# Patient Record
Sex: Female | Born: 1995 | Race: Black or African American | Hispanic: No | Marital: Single | Smoking: Never smoker
Health system: Southern US, Community
[De-identification: ages and names within clinical notes are randomized; demographics above are authoritative.]

## PROBLEM LIST (undated history)

## (undated) DIAGNOSIS — J302 Other seasonal allergic rhinitis: Secondary | ICD-10-CM

## (undated) DIAGNOSIS — J45909 Unspecified asthma, uncomplicated: Secondary | ICD-10-CM

## (undated) DIAGNOSIS — G43909 Migraine, unspecified, not intractable, without status migrainosus: Secondary | ICD-10-CM

## (undated) HISTORY — PX: NO PAST SURGERIES: SHX2092

## (undated) HISTORY — DX: Migraine, unspecified, not intractable, without status migrainosus: G43.909

## (undated) HISTORY — DX: Other seasonal allergic rhinitis: J30.2

---

## 2017-04-16 ENCOUNTER — Ambulatory Visit (HOSPITAL_COMMUNITY)
Admission: EM | Admit: 2017-04-16 | Discharge: 2017-04-16 | Disposition: A | Discharge status: Home / Self Care | Payer: Medicaid Other | Attending: Internal Medicine | Admitting: Internal Medicine

## 2017-04-16 ENCOUNTER — Encounter (HOSPITAL_COMMUNITY): Payer: Self-pay | Admitting: Nurse Practitioner

## 2017-04-16 DIAGNOSIS — N926 Irregular menstruation, unspecified: Secondary | ICD-10-CM | POA: Diagnosis not present

## 2017-04-16 DIAGNOSIS — N898 Other specified noninflammatory disorders of vagina: Secondary | ICD-10-CM | POA: Diagnosis not present

## 2017-04-16 LAB — POCT PREGNANCY, URINE: Preg Test, Ur: NEGATIVE

## 2017-04-16 NOTE — ED Provider Notes (Signed)
CSN: [id number]     Arrival date & time 04/16/17  1639 History   None    Chief Complaint  Patient presents with  . Menstrual Problem   (Consider location/radiation/quality/duration/timing/severity/associated sxs/prior Treatment) 21 year old female comes in for 3 month history of irregular periods. Patient states she has had her period since 21 years old and he usually comes at the same time every month. However, these past 3 months, she has had intermittent spotting, but no full cycle to speak of. She had unprotected sex once back 3 months ago, states partner pulled out, no other birth control. She has also had some vaginal discharge for the past 3 months, white in color, watery, no odor. Admits to gaining weight easily, cold intolerance, changes in skin/hair texture. Denies fever, chills, night sweats. Denies urinary frequency, dysuria, hematuria. Denies abdominal pain, nausea, vomiting, diarrhea.      History reviewed. No pertinent past medical history. History reviewed. No pertinent surgical history. No family history on file. Social History  Substance Use Topics  . Smoking status: Never Smoker  . Smokeless tobacco: Never Used  . Alcohol use No   OB History    No data available     Review of Systems  Constitutional: Negative for chills, diaphoresis and fever.  Gastrointestinal: Negative for abdominal pain, diarrhea, nausea and vomiting.  Endocrine: Negative for cold intolerance.  Genitourinary: Positive for menstrual problem, vaginal bleeding and vaginal discharge. Negative for decreased urine volume, difficulty urinating, dysuria, flank pain, hematuria, pelvic pain, urgency and vaginal pain.    Allergies  Patient has no known allergies.  Home Medications   Prior to Admission medications   Not on File   Meds Ordered and Administered this Visit  Medications - No data to display  BP 122/74   Pulse 80   Temp 98.7 F (37.1 C) (Oral)   Resp 16   LMP 02/07/2017   SpO2  100%  No data found.   Physical Exam  Constitutional: She is oriented to person, place, and time. She appears well-developed and well-nourished. No distress.  HENT:  Head: Normocephalic and atraumatic.  Eyes: Pupils are equal, round, and reactive to light. Conjunctivae are normal.  Neck: Normal range of motion. Neck supple. No thyromegaly present.  Cardiovascular: Normal rate, regular rhythm and normal heart sounds.  Exam reveals no gallop and no friction rub.   No murmur heard. Pulmonary/Chest: Effort normal and breath sounds normal. She has no wheezes. She has no rales.  Abdominal: Soft. Bowel sounds are normal. She exhibits no mass. There is no tenderness. There is no rebound and no guarding.  Neurological: She is alert and oriented to person, place, and time.  Skin: Skin is warm and dry.  Psychiatric: She has a normal mood and affect. Her behavior is normal. Judgment normal.    Urgent Care Course     Procedures (including critical care time)  Labs Review Labs Reviewed  POCT PREGNANCY, URINE  URINE CYTOLOGY ANCILLARY ONLY    Imaging Review No results found.      MDM   1. Irregular menstrual cycle    Discussed lab results with patient, urine pregnancy negative today. Will send urine for cytology, patient will be contacted with any positive results, additional treatment needed will be sent in then. Discussed with patient possible causes of irregular cycle, including thyroid disease, stress, changes in hormone. Patient to follow up with GYN for further workup. Resources given to patient.    Belinda FisherYu, Amy V, PA-C 04/16/17 1806

## 2017-04-16 NOTE — Discharge Instructions (Signed)
Your urine was negative for pregnancy. It will be sent off to cytology, any positive results that need additional treatment will be called in then. Follow up with GYN for further evaluation of your symptoms.

## 2017-04-16 NOTE — ED Triage Notes (Addendum)
Pt presents with c/o irregular menstrual cycle. She reports she's had irregular, lighter periods since April. She has taken four pregnancy tests at home that were negative. Shes also had an abnormal light white vaginal discharge for the past two months.

## 2017-04-17 LAB — URINE CYTOLOGY ANCILLARY ONLY
Chlamydia: NEGATIVE
Neisseria Gonorrhea: NEGATIVE
Trichomonas: NEGATIVE

## 2017-04-19 LAB — URINE CYTOLOGY ANCILLARY ONLY: Candida vaginitis: NEGATIVE

## 2017-04-20 ENCOUNTER — Telehealth (HOSPITAL_COMMUNITY): Payer: Self-pay | Admitting: Internal Medicine

## 2017-04-20 MED ORDER — METRONIDAZOLE 500 MG PO TABS: 500.0000 mg | ORAL_TABLET | Freq: Two times a day (BID) | ORAL | 0 refills | Status: DC

## 2017-04-20 NOTE — Telephone Encounter (Signed)
Clinical staff, please let patient know that test for gardnerella (bacterial vaginosis) was positive.  Rx metronidazole was sent to [facility] pharmacy [facility].  Recheck for further evaluation if symptoms are not improving.  LM

## 2017-04-24 MED FILL — metroNIDAZOLE 500 MG TABS: 500 | 7 days supply | Qty: 14 | Fill #0

## 2017-08-17 ENCOUNTER — Emergency Department (HOSPITAL_COMMUNITY): Payer: Medicaid Other

## 2017-08-17 ENCOUNTER — Other Ambulatory Visit: Payer: Self-pay

## 2017-08-17 ENCOUNTER — Encounter (HOSPITAL_COMMUNITY): Payer: Self-pay | Admitting: *Deleted

## 2017-08-17 ENCOUNTER — Emergency Department (HOSPITAL_COMMUNITY)
Admission: EM | Admit: 2017-08-17 | Discharge: 2017-08-17 | Disposition: A | Discharge status: Home / Self Care | Payer: Medicaid Other | Attending: Emergency Medicine | Admitting: Emergency Medicine

## 2017-08-17 DIAGNOSIS — R000 Tachycardia, unspecified: Secondary | ICD-10-CM | POA: Insufficient documentation

## 2017-08-17 DIAGNOSIS — Z79899 Other long term (current) drug therapy: Secondary | ICD-10-CM | POA: Insufficient documentation

## 2017-08-17 DIAGNOSIS — J40 Bronchitis, not specified as acute or chronic: Secondary | ICD-10-CM | POA: Diagnosis not present

## 2017-08-17 DIAGNOSIS — R112 Nausea with vomiting, unspecified: Secondary | ICD-10-CM | POA: Diagnosis not present

## 2017-08-17 DIAGNOSIS — R0789 Other chest pain: Secondary | ICD-10-CM | POA: Diagnosis present

## 2017-08-17 DIAGNOSIS — R0602 Shortness of breath: Secondary | ICD-10-CM | POA: Insufficient documentation

## 2017-08-17 DIAGNOSIS — J45909 Unspecified asthma, uncomplicated: Secondary | ICD-10-CM | POA: Insufficient documentation

## 2017-08-17 DIAGNOSIS — R05 Cough: Secondary | ICD-10-CM | POA: Insufficient documentation

## 2017-08-17 DIAGNOSIS — R0981 Nasal congestion: Secondary | ICD-10-CM | POA: Diagnosis not present

## 2017-08-17 LAB — BASIC METABOLIC PANEL
Anion gap: 7 (ref 5–15)
BUN: 12 mg/dL (ref 6–20)
CO2: 23 mmol/L (ref 22–32)
Calcium: 9.1 mg/dL (ref 8.9–10.3)
Chloride: 107 mmol/L (ref 101–111)
Creatinine, Ser: 0.56 mg/dL (ref 0.44–1.00)
GFR calc Af Amer: >60 mL/min (ref >60)
GFR calc non Af Amer: >60 mL/min (ref >60)
Glucose, Bld: 107 mg/dL — ABNORMAL HIGH (ref 65–99)
Potassium: 3.8 mmol/L (ref 3.5–5.1)
Sodium: 137 mmol/L (ref 135–145)

## 2017-08-17 LAB — I-STAT TROPONIN, ED: Troponin i, poc: 0 ng/mL (ref 0.00–0.08)

## 2017-08-17 LAB — I-STAT BETA HCG BLOOD, ED (MC, WL, AP ONLY): I-stat hCG, quantitative: <5 m[IU]/mL (ref <5)

## 2017-08-17 LAB — CBC
HCT: 37.3 % (ref 36.0–46.0)
Hemoglobin: 12.5 g/dL (ref 12.0–15.0)
MCH: 31.2 pg (ref 26.0–34.0)
MCHC: 33.5 g/dL (ref 30.0–36.0)
MCV: 93 fL (ref 78.0–100.0)
Platelets: 296 10*3/uL (ref 150–400)
RBC: 4.01 MIL/uL (ref 3.87–5.11)
RDW: 13.3 % (ref 11.5–15.5)
WBC: 6.6 10*3/uL (ref 4.0–10.5)

## 2017-08-17 LAB — D-DIMER, QUANTITATIVE: D-Dimer, Quant: 0.6 ug/mL-FEU — ABNORMAL HIGH (ref 0.00–0.50)

## 2017-08-17 MED ORDER — BENZONATATE 100 MG PO CAPS: 100.0000 mg | ORAL_CAPSULE | Freq: Three times a day (TID) | ORAL | 0 refills | Status: DC

## 2017-08-17 MED ORDER — PREDNISONE 20 MG PO TABS
40.0000 mg | ORAL_TABLET | Freq: Once | ORAL | Status: AC
  Administered 2017-08-17: 40 mg via ORAL
  Filled 2017-08-17: qty 2

## 2017-08-17 MED ORDER — BENZONATATE 100 MG PO CAPS
100.0000 mg | ORAL_CAPSULE | Freq: Once | ORAL | Status: AC
  Administered 2017-08-17: 100 mg via ORAL
  Filled 2017-08-17: qty 1

## 2017-08-17 MED ORDER — SODIUM CHLORIDE 0.9 % IV BOLUS (SEPSIS)
500.0000 mL | Freq: Once | INTRAVENOUS | Status: AC
  Administered 2017-08-17: 500 mL via INTRAVENOUS

## 2017-08-17 MED ORDER — PREDNISONE 20 MG PO TABS: 20.0000 mg | ORAL_TABLET | Freq: Two times a day (BID) | ORAL | 0 refills | Status: AC

## 2017-08-17 MED ORDER — IBUPROFEN 400 MG PO TABS
600.0000 mg | ORAL_TABLET | Freq: Once | ORAL | Status: AC
  Administered 2017-08-17: 600 mg via ORAL
  Filled 2017-08-17: qty 1

## 2017-08-17 MED ORDER — GUAIFENESIN ER 600 MG PO TB12: 1200.0000 mg | ORAL_TABLET | Freq: Two times a day (BID) | ORAL | 0 refills | Status: AC

## 2017-08-17 MED ORDER — ALBUTEROL SULFATE HFA 108 (90 BASE) MCG/ACT IN AERS
1.0000 | INHALATION_SPRAY | Freq: Once | RESPIRATORY_TRACT | Status: AC
  Administered 2017-08-17: 1 via RESPIRATORY_TRACT
  Filled 2017-08-17: qty 6.7

## 2017-08-17 MED ORDER — IPRATROPIUM-ALBUTEROL 0.5-2.5 (3) MG/3ML IN SOLN
3.0000 mL | Freq: Once | RESPIRATORY_TRACT | Status: AC
  Administered 2017-08-17: 3 mL via RESPIRATORY_TRACT
  Filled 2017-08-17: qty 3

## 2017-08-17 MED ORDER — IPRATROPIUM-ALBUTEROL 0.5-2.5 (3) MG/3ML IN SOLN
3.0000 mL | Freq: Once | RESPIRATORY_TRACT | Status: AC
Start: 2017-08-17 — End: 2017-08-17
  Administered 2017-08-17: 3 mL via RESPIRATORY_TRACT
  Filled 2017-08-17: qty 3

## 2017-08-17 MED ORDER — IOPAMIDOL (ISOVUE-370) INJECTION 76%
INTRAVENOUS | Status: AC
  Administered 2017-08-17: 100 mL
  Filled 2017-08-17: qty 100

## 2017-08-17 NOTE — ED Notes (Signed)
ED Provider at bedside. 

## 2017-08-17 NOTE — ED Notes (Signed)
Pt states continues to be short of breath with ambulation.  O2 sats at 100% during ambulation.  Heart rate increased to 120.

## 2017-08-17 NOTE — Discharge Instructions (Addendum)
Please read and follow all provided instructions.  Your diagnoses today include:  1. Bronchitis     You appear to have an upper respiratory infection (URI) that has progressed to bronchitis. An upper respiratory tract infection, or cold, is a viral infection of the air passages leading to the lungs. It should improve gradually after 5-7 days. You may have a lingering cough that lasts for 2- 4 weeks after the infection.  Tests performed today include: Vital signs. See below for your results today.  Chest x-ray. There is no evidence of pneumonia. CT of the chest. There is no evidence of a pulmonary embolus. Electrolytes, kidney function, and blood counts are normal.  Medications prescribed:   Take any prescribed medications only as directed. Treatment for your infection is aimed at treating the symptoms. There are no medications, such as antibiotics, that will cure your infection.   You are prescribed prednisone, a steroid in the ED for lung inflammation.   Common side effects include upset stomach/nausea. You may take this medicine with food if this occurs. Other side effects include restlessness, difficulty sleeping, and increased sweating. Call your healthcare provider if these do not resolve after finishing the medication.  This medicine may increase your blood sugar so additional careful monitoring is needed of blood sugar if you have diabetes. Call your healthcare provider for any signs/symtpoms of high blood sugar such as confusion, feeling sleepy, more thirst, more hunger, passing urine more often, flushing, fast breathing, or breath that smells like fruit.  Tessalon.  This is a medicine to help reduce your cough.  Please ensure that this stays away from children, as it is dangerous in overdose to children.  Home care instructions:  Follow any educational materials contained in this packet.   Your illness is contagious and can be spread to others, especially during the first 3 or 4  days. It cannot be cured by antibiotics or other medicines. Take basic precautions such as washing your hands often, covering your mouth when you cough or sneeze, and avoiding public places where you could spread your illness to others.   Please continue drinking plenty of fluids.  Use over-the-counter medicines as needed as directed on packaging for symptom relief.  You may also use ibuprofen or tylenol as directed on packaging for pain or fever.  Do not take multiple medicines containing Tylenol or acetaminophen to avoid taking too much of this medication.  Follow-up instructions: Please follow-up with your primary care provider in the next 3 days for further evaluation of your symptoms if you are not feeling better.   Return instructions:  Please return to the Emergency Department if you experience worsening symptoms.  RETURN IMMEDIATELY IF you develop shortness of breath, confusion or altered mental status, a new rash, become dizzy, faint, or poorly responsive, or are unable to be cared for at home. Please return if you have persistent vomiting and cannot keep down fluids or develop a fever that is not controlled by tylenol or motrin.   Please return if you have any other emergent concerns.  Additional Information:  Your vital signs today were: BP (!) 141/79    Pulse 84    Resp 16    Ht 5\' 7"  (1.702 m)    Wt 97.5 kg (215 lb)    LMP 08/16/2017 (Approximate)    SpO2 99%    BMI 33.67 kg/m  If your blood pressure (BP) was elevated above 135/85 this visit, please have this repeated by your doctor within  one month. --------------

## 2017-08-17 NOTE — ED Notes (Signed)
Pt ambulated to bathroom without difficulty.

## 2017-08-17 NOTE — ED Notes (Signed)
CT contacted for possible time they may be able to take patient over for scan, Kim in CT advised patient has 2 people ahead of her at this time

## 2017-08-17 NOTE — ED Notes (Signed)
Patient transported to CT 

## 2017-08-17 NOTE — ED Triage Notes (Signed)
Pt working today and had 8/10 mid non radiating cp & SOB, pt reports cold like symptoms x 5 days with non productive cough and stuffy nose, no cardiac hx, A&O x4, afebrile

## 2017-08-17 NOTE — ED Provider Notes (Signed)
Care assumed from  [facility] PA-C at shift change with CTA Chest pending.  In brief, this patient is a 21 y.o. F who presents with a burning chest pain and cough that began today. Patient also endorsed difficulty breathing. On initial ED arrival, patient was afebrile and O2 sats were greater than 95% on room air.  No wheezing noted on exam.  Patient with no evidence of respiratory distress.  Initial workup included a reassuring EKG and negative troponin.  D-dimer was positive.  Patient did have 3 times breathing treatments.  Please see full H&P from previous provider for full ED course.  PLAN: CT of chest is pending after positive d-dimer to rule out PE.  If negative, plan to do ambulatory pulse ox in the department.  If O2 sats are stable, plan to discharge home as bronchitis with steroid burst and albuterol inhaler.  MDM:  CTA of chest reviewed.  Negative for any acute pulmonary embolism.  We will plan to ambulate patient with pulse ox.  Patient ambulated in the emergency department.  O2 sat remained 100% during ambulation.  She did have an episode of tachycardia and coughing.  Patient also reports feeling that she has some mild shortness of breath but was able to complete it.  I personally discussed results with patient.  During my conversations with her, her O2 sat remained greater than 97% on room air.  Patient with no evidence of respiratory distress and is able to speak in full sentences without any difficulty.  Repeat lung exam shows lungs are clear to auscultation bilaterally.  She is moving good air with no evidence of wheezing.  At this time, I do not feel that patient needs inpatient admission.  I suspect symptoms are likely secondary to bronchitis.  I discussed this with patient.  She feels comfortable with the plan to be discharged home.  Family also feels comfortable with this plan will closely monitor for symptoms.  Instructed patient to use albuterol inhaler as directed.  Patient  [facility] she is instructed to pick up. Strict return precautions discussed. Patient expresses understanding and agreement to plan.   Patient's prescriptions were sent to the outpatient pharmacy but nurse stated that patient was unaware.  I personally called the patient to see if she had gotten the prescriptions.  Patient reports that [facility] and had gone to go get them.  Based on discharge paperwork, it appears that prescriptions were [facility].  I personally called in patient's prescription to patient's pharmacy of choice.  1. Bronchitis        Maxwell CaulLayden, Amarien Carne A, PA-C 08/18/17 [id number]    Tegeler, Canary Brimhristopher J, MD 08/18/17 0100

## 2017-08-17 NOTE — ED Notes (Signed)
Pt. Ambulated in hall on pulse ox, saturation remained at 100 percent. Heart rate was elevated and pt complained of shob. EDP aware.

## 2017-08-17 NOTE — ED Provider Notes (Signed)
[facility] EMERGENCY DEPARTMENT Provider Note   CSN: [id number] Arrival date & time: 08/17/17  1191     History   Chief Complaint Chief Complaint  Patient presents with  . Chest Pain    HPI Jasmine Daugherty is a 21 y.o. female.  HPI   Patient is a 21 year old female with a remote history of asthma presenting for some substernal chest burning in the setting of a URI.  Patient reports that her symptoms have been occurring for 5 days, and she developed a nonproductive cough today.  Patient reports she was at work when she noted some substernal chest burning.  Patient was not doing anything in particular extremities when this occurred.  No remedies tried prior to evaluation for symptoms.  Patient feels that this cough is interfering with her breathing.  No production of sputum or hemoptysis.  No nausea or vomiting.  No fever or chills. Patient denies any pertinent risk factors for DVT/PE. No unilateral leg swelling, redness, or calf TTP, no immobilization or surgery in previous 4 weeks, no personal history of DVT/PE, no hemoptysis, and no treatment of active malignancy in previous 6 months.  Patient has not had an asthma exacerbation in several years, and has not needed an inhaler.  Patient does not smoke.  History reviewed. No pertinent past medical history.  There are no active problems to display for this patient.   History reviewed. No pertinent surgical history.  OB History    No data available       Home Medications    Prior to Admission medications   Medication Sig Start Date End Date Taking? Authorizing Provider  benzonatate (TESSALON) 100 MG capsule Take 1 capsule (100 mg total) every 8 (eight) hours by mouth. 08/17/17   Aviva Kluver B, PA-C  guaiFENesin (MUCINEX) 600 MG 12 hr tablet Take 2 tablets (1,200 mg total) 2 (two) times daily for 7 days by mouth. 08/17/17 08/24/17  Aviva Kluver B, PA-C  metroNIDAZOLE (FLAGYL) 500 MG tablet Take 1 tablet  (500 mg total) by mouth 2 (two) times daily. 04/20/17   Eustace Moore, MD  predniSONE (DELTASONE) 20 MG tablet Take 1 tablet (20 mg total) 2 (two) times daily with a meal for 4 days by mouth. 08/17/17 08/21/17  Elisha Ponder, PA-C    Family History No family history on file.  Social History Social History   Tobacco Use  . Smoking status: Never Smoker  . Smokeless tobacco: Never Used  Substance Use Topics  . Alcohol use: No  . Drug use: No     Allergies   Patient has no known allergies.   Review of Systems Review of Systems  Constitutional: Negative for chills and fever.  HENT: Positive for congestion and rhinorrhea.   Respiratory: Positive for cough, chest tightness and shortness of breath. Negative for wheezing.   Gastrointestinal: Positive for nausea and vomiting. Negative for abdominal pain.  Genitourinary: Negative for dysuria.  Musculoskeletal: Negative for myalgias.  All other systems reviewed and are negative.    Physical Exam Updated Vital Signs BP (!) 141/79   Pulse 84   Resp 16   Ht 5\' 7"  (1.702 m)   Wt 97.5 kg (215 lb)   LMP 08/16/2017 (Approximate)   SpO2 99%   BMI 33.67 kg/m   Physical Exam  Constitutional: She appears well-developed and well-nourished. No distress.  HENT:  Head: Normocephalic and atraumatic.  Mouth/Throat: Oropharynx is clear and moist.  Eyes: Conjunctivae and EOM are normal.  Pupils are equal, round, and reactive to light.  Neck: Normal range of motion. Neck supple.  Cardiovascular: Normal rate, regular rhythm, S1 normal and S2 normal.  No murmur heard. No lower extremity edema. Equal pulses in b/l upper extremities.  Pulmonary/Chest: Effort normal and breath sounds normal. She has no wheezes. She has no rales.  Abdominal: Soft. She exhibits no distension. There is no tenderness. There is no guarding.  Musculoskeletal: Normal range of motion. She exhibits no edema or deformity.  Lymphadenopathy:    She has no cervical  adenopathy.  Neurological: She is alert.  Cranial nerves grossly intact. Patient was extremities with good coordination and symmetrically.  Skin: Skin is warm and dry. No rash noted. No erythema.  Psychiatric: She has a normal mood and affect. Her behavior is normal. Judgment and thought content normal.  Nursing note and vitals reviewed.    ED Treatments / Results  Labs (all labs ordered are listed, but only abnormal results are displayed) Labs Reviewed  BASIC METABOLIC PANEL - Abnormal; Notable for the following components:      Result Value   Glucose, Bld 107 (*)    All other components within normal limits  D-DIMER, QUANTITATIVE (NOT AT [facility]) - Abnormal; Notable for the following components:   D-Dimer, Quant 0.60 (*)    All other components within normal limits  CBC  I-STAT TROPONIN, ED  I-STAT BETA HCG BLOOD, ED ([facility], [facility], [facility] ONLY)    EKG  EKG Interpretation  Date/Time:  Friday August 17 2017 10:01:41 EST Ventricular Rate:  91 PR Interval:  142 QRS Duration: 88 QT Interval:  364 QTC Calculation: 447 R Axis:   87 Text Interpretation:  Normal sinus rhythm Normal ECG No previous tracing Confirmed by Gwyneth SproutPlunkett, Whitney ([id number]) on 08/17/2017 12:58:28 PM       Radiology Dg Chest 2 View  Result Date: 08/17/2017 CLINICAL DATA:  Chest pain and shortness of breath. EXAM: CHEST  2 VIEW COMPARISON:  None. FINDINGS: The heart size and mediastinal contours are within normal limits. Both lungs are clear. The visualized skeletal structures are unremarkable. IMPRESSION: Normal chest x-ray. Electronically Signed   By: Obie DredgeWilliam T Derry M.D.   On: 08/17/2017 10:42   Ct Angio Chest Pe W/cm &/or Wo Cm  Result Date: 08/17/2017 CLINICAL DATA:  Shortness of breath with chest pain EXAM: CT ANGIOGRAPHY CHEST WITH CONTRAST TECHNIQUE: Multidetector CT imaging of the chest was performed using the standard protocol during bolus administration of intravenous contrast. Multiplanar CT image  reconstructions and MIPs were obtained to evaluate the vascular anatomy. CONTRAST:  60 ml ISOVUE-370 IOPAMIDOL (ISOVUE-370) INJECTION 76% COMPARISON:  08/17/2017 FINDINGS: Cardiovascular: Satisfactory opacification of the pulmonary arteries to the segmental level. No evidence of pulmonary embolism. Nonaneurysmal aorta. No dissection. Heart size upper normal. No pericardial effusion Mediastinum/Nodes: No enlarged mediastinal, hilar, or axillary lymph nodes. Thyroid gland, trachea, and esophagus demonstrate no significant findings. Lungs/Pleura: Lungs are clear. No pleural effusion or pneumothorax. Upper Abdomen: No acute abnormality. Musculoskeletal: No chest wall abnormality. No acute or significant osseous findings. Review of the MIP images confirms the above findings. IMPRESSION: No CT evidence for acute pulmonary embolus or aortic dissection. Clear lung fields. Electronically Signed   By: Jasmine PangKim  Fujinaga M.D.   On: 08/17/2017 18:32    Procedures Procedures (including critical care time)  Medications Ordered in ED Medications  ipratropium-albuterol (DUONEB) 0.5-2.5 (3) MG/3ML nebulizer solution 3 mL (3 mLs Nebulization Given 08/17/17 1258)  ipratropium-albuterol (DUONEB) 0.5-2.5 (3) MG/3ML nebulizer solution 3  mL (3 mLs Nebulization Given 08/17/17 1344)  ipratropium-albuterol (DUONEB) 0.5-2.5 (3) MG/3ML nebulizer solution 3 mL (3 mLs Nebulization Given 08/17/17 1344)  ibuprofen (ADVIL,MOTRIN) tablet 600 mg (600 mg Oral Given 08/17/17 1343)  predniSONE (DELTASONE) tablet 40 mg (40 mg Oral Given 08/17/17 1437)  albuterol (PROVENTIL HFA;VENTOLIN HFA) 108 (90 Base) MCG/ACT inhaler 1 puff (1 puff Inhalation Given 08/17/17 1437)  sodium chloride 0.9 % bolus 500 mL (500 mLs Intravenous New Bag/Given 08/17/17 1835)  iopamidol (ISOVUE-370) 76 % injection (100 mLs  Contrast Given 08/17/17 1813)  benzonatate (TESSALON) capsule 100 mg (100 mg Oral Given 08/17/17 1835)     Initial Impression / Assessment and  Plan / ED Course  I have reviewed the triage vital signs and the nursing notes.  Pertinent labs & imaging results that were available during my care of the patient were reviewed by me and considered in my medical decision making (see chart for details).     Final Clinical Impressions(s) / ED Diagnoses   Final diagnoses:  Bronchitis   Patient is nontoxic-appearing, afebrile, and in no acute distress.  Patient is not tachypneic or tachycardic.  Patient oxygen saturations between 95-100% on room air.  Troponin negative.  There is no leukocytosis.  Chest x-ray demonstrates no evidence of infiltrate.  Therefore, doubt pneumonia.  Doubt ACS.  Doubt myocarditis, as there is no elevation in troponin.  Doubt pericarditis, as there are no EKG changes and pain is not positional.  Patient is describing feeling more short of breath and she has prior episodes.  Patient does not feel this is consistent with asthma. Well's negative. PERC negative.  Will reassess after breathing treatments.  EKG demonstrates no evidence of ischemia, infarction, or arrhythmia.    1:27pm Patient reevaluated.  Patient continues to have some shortness of breath.  Will order additional breathing treatments to maximize therapy. Patient stood ambulated. No desaturation but patient became significantly tachycardic into the 130s with a relation.  This resolved slowly.  Case is discussed with Dr. Gwyneth SproutWhitney Plunkett.  We discussed due to tachycardia and shortness of breath out of proportion to bronchitis, will order d-dimer.  4:20pm Patient reevaluated. D-dimer positive. Patient informed that we will be pursuing CTA.   Patient care signed out to Gwendalyn EgeLindsay Layden, PA-C while awaiting results of CT angiogram.  If negative, patient can be safely discharged with her inhaler as well as prescriptions for Mucinex, Tessalon, and a 4-day course of steroids.  If positive, will pursue admission.  Handoff performed at bedside.  Patient and family are in  understanding and agree with the plan of care.  ED Discharge Orders        Ordered    predniSONE (DELTASONE) 20 MG tablet  2 times daily with meals     08/17/17 1844    benzonatate (TESSALON) 100 MG capsule  Every 8 hours     08/17/17 1844    guaiFENesin (MUCINEX) 600 MG 12 hr tablet  2 times daily     08/17/17 1844       Delia ChimesMurray, Claudia Greenley B, PA-C 08/17/17 Wray [id number]    Plunkett, Whitney, MD 08/17/17 2012

## 2017-08-18 ENCOUNTER — Telehealth: Payer: Self-pay | Admitting: *Deleted

## 2017-08-18 NOTE — Telephone Encounter (Signed)
While explaining the policy of not reissuing Rxs or calling Rxs in to Pharmacies I came across a note by Orpah ClintonLindsay Laydon , PA-C that [facility]. I called [facility] to see if they were there. They just need a few bits of info and an NPI number to complete. Mother very appreciative of assistance. No further CM need at this time

## 2017-11-17 ENCOUNTER — Encounter (HOSPITAL_COMMUNITY): Payer: Self-pay | Admitting: *Deleted

## 2017-11-17 ENCOUNTER — Emergency Department (HOSPITAL_COMMUNITY)
Admission: EM | Admit: 2017-11-17 | Discharge: 2017-11-17 | Disposition: A | Discharge status: Home / Self Care | Payer: Medicaid Other | Attending: Emergency Medicine | Admitting: Emergency Medicine

## 2017-11-17 ENCOUNTER — Emergency Department (HOSPITAL_COMMUNITY): Payer: Medicaid Other

## 2017-11-17 ENCOUNTER — Other Ambulatory Visit: Payer: Self-pay

## 2017-11-17 DIAGNOSIS — R519 Headache, unspecified: Secondary | ICD-10-CM

## 2017-11-17 DIAGNOSIS — R51 Headache: Secondary | ICD-10-CM | POA: Diagnosis not present

## 2017-11-17 LAB — CBC WITH DIFFERENTIAL/PLATELET
Basophils Absolute: 0 10*3/uL (ref 0.0–0.1)
Basophils Relative: 0 %
Eosinophils Absolute: 0.1 10*3/uL (ref 0.0–0.7)
Eosinophils Relative: 2 %
HCT: 38.6 % (ref 36.0–46.0)
Hemoglobin: 12.7 g/dL (ref 12.0–15.0)
Lymphocytes Relative: 37 %
Lymphs Abs: 2.7 10*3/uL (ref 0.7–4.0)
MCH: 31 pg (ref 26.0–34.0)
MCHC: 32.9 g/dL (ref 30.0–36.0)
MCV: 94.1 fL (ref 78.0–100.0)
Monocytes Absolute: 0.6 10*3/uL (ref 0.1–1.0)
Monocytes Relative: 8 %
Neutro Abs: 3.9 10*3/uL (ref 1.7–7.7)
Neutrophils Relative %: 53 %
Platelets: 316 10*3/uL (ref 150–400)
RBC: 4.1 MIL/uL (ref 3.87–5.11)
RDW: 12.5 % (ref 11.5–15.5)
WBC: 7.3 10*3/uL (ref 4.0–10.5)

## 2017-11-17 LAB — COMPREHENSIVE METABOLIC PANEL
ALT: 23 U/L (ref 14–54)
AST: 25 U/L (ref 15–41)
Albumin: 3.6 g/dL (ref 3.5–5.0)
Alkaline Phosphatase: 68 U/L (ref 38–126)
Anion gap: 10 (ref 5–15)
BUN: 7 mg/dL (ref 6–20)
CO2: 23 mmol/L (ref 22–32)
Calcium: 8.9 mg/dL (ref 8.9–10.3)
Chloride: 106 mmol/L (ref 101–111)
Creatinine, Ser: 0.83 mg/dL (ref 0.44–1.00)
GFR calc Af Amer: >60 mL/min (ref >60)
GFR calc non Af Amer: >60 mL/min (ref >60)
Glucose, Bld: 82 mg/dL (ref 65–99)
Potassium: 4.1 mmol/L (ref 3.5–5.1)
Sodium: 139 mmol/L (ref 135–145)
Total Bilirubin: 0.5 mg/dL (ref 0.3–1.2)
Total Protein: 7 g/dL (ref 6.5–8.1)

## 2017-11-17 LAB — I-STAT BETA HCG BLOOD, ED (MC, WL, AP ONLY): I-stat hCG, quantitative: <5 m[IU]/mL (ref <5)

## 2017-11-17 MED ORDER — SODIUM CHLORIDE 0.9 % IV BOLUS (SEPSIS)
1000.0000 mL | Freq: Once | INTRAVENOUS | Status: AC
  Administered 2017-11-17: 1000 mL via INTRAVENOUS

## 2017-11-17 MED ORDER — SUMATRIPTAN SUCCINATE 100 MG PO TABS: 100.0000 mg | ORAL_TABLET | ORAL | 0 refills | Status: DC | PRN

## 2017-11-17 MED ORDER — METOCLOPRAMIDE HCL 5 MG/ML IJ SOLN
10.0000 mg | Freq: Once | INTRAMUSCULAR | Status: AC
  Administered 2017-11-17: 10 mg via INTRAVENOUS
  Filled 2017-11-17: qty 2

## 2017-11-17 MED ORDER — LORAZEPAM 2 MG/ML IJ SOLN
1.0000 mg | Freq: Once | INTRAMUSCULAR | Status: AC
  Administered 2017-11-17: 1 mg via INTRAVENOUS
  Filled 2017-11-17: qty 1

## 2017-11-17 MED ORDER — DIPHENHYDRAMINE HCL 50 MG/ML IJ SOLN
25.0000 mg | Freq: Once | INTRAMUSCULAR | Status: AC
  Administered 2017-11-17: 25 mg via INTRAVENOUS
  Filled 2017-11-17: qty 1

## 2017-11-17 MED ORDER — KETOROLAC TROMETHAMINE 15 MG/ML IJ SOLN
30.0000 mg | Freq: Once | INTRAMUSCULAR | Status: AC
  Administered 2017-11-17: 30 mg via INTRAVENOUS
  Filled 2017-11-17: qty 2

## 2017-11-17 MED ORDER — DEXAMETHASONE SODIUM PHOSPHATE 10 MG/ML IJ SOLN
10.0000 mg | Freq: Once | INTRAMUSCULAR | Status: AC
  Administered 2017-11-17: 10 mg via INTRAVENOUS
  Filled 2017-11-17: qty 1

## 2017-11-17 NOTE — Discharge Instructions (Signed)
See the Neurologist as scheduled °

## 2017-11-17 NOTE — ED Notes (Signed)
Patient transported to CT 

## 2017-11-17 NOTE — ED Provider Notes (Signed)
[facility] EMERGENCY DEPARTMENT Provider Note   CSN: [id number] Arrival date & time: 11/17/17  2130     History   Chief Complaint Chief Complaint  Patient presents with  . Headache    HPI Tareka Jhaveri is a 22 y.o. female.  The history is provided by the patient.  Headache   This is a new problem. Episode onset: 5 days. The problem occurs constantly. The problem has been gradually worsening. The headache is associated with nothing. The quality of the pain is described as sharp. The pain is at a severity of 10/10. The pain is moderate. The pain does not radiate. Pertinent negatives include no anorexia, no nausea and no vomiting. She has tried acetaminophen and NSAIDs for the symptoms. The treatment provided no relief.  Pt reports she has had a headache since Tuesday.  Pt reports no relief with ibuprofen or tylenol.  Pt left work today due to headache.   History reviewed. No pertinent past medical history.  There are no active problems to display for this patient.   History reviewed. No pertinent surgical history.  OB History    No data available       Home Medications    Prior to Admission medications   Medication Sig Start Date End Date Taking? Authorizing Provider  acetaminophen (TYLENOL) 500 MG tablet Take 500 mg by mouth every 6 (six) hours as needed for headache.   Yes [provider]  ibuprofen (ADVIL,MOTRIN) 100 MG tablet Take 100 mg by mouth every 6 (six) hours as needed (headaches).   Yes [provider]  SUMAtriptan (IMITREX) 100 MG tablet Take 1 tablet (100 mg total) by mouth every 2 (two) hours as needed for migraine. May repeat in 2 hours if headache persists or recurs. 11/17/17   Elson Areas, PA-C    Family History History reviewed. No pertinent family history.  Social History Social History   Tobacco Use  . Smoking status: Never Smoker  . Smokeless tobacco: Never Used  Substance Use Topics  . Alcohol use:  No  . Drug use: No     Allergies   Patient has no known allergies.   Review of Systems Review of Systems  Gastrointestinal: Negative for anorexia, nausea and vomiting.  Neurological: Positive for headaches.  All other systems reviewed and are negative.    Physical Exam Updated Vital Signs BP 103/71   Pulse 77   Temp 98.6 F (37 C) (Oral)   Resp 16   LMP 10/27/2017   SpO2 100%   Physical Exam  Constitutional: She appears well-developed and well-nourished. No distress.  HENT:  Head: Normocephalic and atraumatic.  Mouth/Throat: Oropharynx is clear and moist.  Eyes: Conjunctivae and EOM are normal. Pupils are equal, round, and reactive to light.  Neck: Normal range of motion. Neck supple.  Cardiovascular: Normal rate, regular rhythm and normal heart sounds.  No murmur heard. Pulmonary/Chest: Effort normal and breath sounds normal. No respiratory distress.  Abdominal: Soft. There is no tenderness.  Musculoskeletal: She exhibits no edema.  Neurological: She is alert. She has normal strength.  Skin: Skin is warm and dry.  Psychiatric: She has a normal mood and affect.  Nursing note and vitals reviewed.    ED Treatments / Results  Labs (all labs ordered are listed, but only abnormal results are displayed) Labs Reviewed  CBC WITH DIFFERENTIAL/PLATELET  COMPREHENSIVE METABOLIC PANEL  I-STAT BETA HCG BLOOD, ED ([facility], [facility], [facility] ONLY)    EKG  EKG Interpretation  None       Radiology Ct Head Wo Contrast  Result Date: 11/17/2017 CLINICAL DATA:  Severe headaches. EXAM: CT HEAD WITHOUT CONTRAST TECHNIQUE: Contiguous axial images were obtained from the base of the skull through the vertex without intravenous contrast. COMPARISON:  No comparison studies available. FINDINGS: Brain: There is no evidence for acute hemorrhage, hydrocephalus, mass lesion, or abnormal extra-axial fluid collection. No definite CT evidence for acute infarction. Vascular: No hyperdense vessel or  unexpected calcification. Skull: No evidence for fracture. No worrisome lytic or sclerotic lesion. Sinuses/Orbits: The visualized paranasal sinuses and mastoid air cells are clear. Visualized portions of the globes and intraorbital fat are unremarkable. Other: None. IMPRESSION: Normal exam. Electronically Signed   By: Kennith CenterEric  Mansell M.D.   On: 11/17/2017 13:38    Procedures Procedures (including critical care time)  Medications Ordered in ED Medications  metoCLOPramide (REGLAN) injection 10 mg (10 mg Intravenous Given 11/17/17 1301)  diphenhydrAMINE (BENADRYL) injection 25 mg (25 mg Intravenous Given 11/17/17 1301)  ketorolac (TORADOL) 15 MG/ML injection 30 mg (30 mg Intravenous Given 11/17/17 1302)  sodium chloride 0.9 % bolus 1,000 mL (0 mLs Intravenous Stopped 11/17/17 1450)  dexamethasone (DECADRON) injection 10 mg (10 mg Intravenous Given 11/17/17 1408)  LORazepam (ATIVAN) injection 1 mg (1 mg Intravenous Given 11/17/17 1407)     Initial Impression / Assessment and Plan / ED Course  I have reviewed the triage vital signs and the nursing notes.  Pertinent labs & imaging results that were available during my care of the patient were reviewed by me and considered in my medical decision making (see chart for details).     Pt reports no relief with torodol, reglan and benadryl.   Pt given ativan and decadron.  Pt reports minimal relief.  Pt reports she wants a ct scan of her head.  Ct scan obtained and reviewed by me.  Ct scan I snormal.  I doubt SAH, I suspect migraine etiology to headache.  I hope decadron will provide further relief.  I will try pt on Imitrex orally.  Pt has an appointment with neurology on March 19.  Pt looks good appears stable for discharge.  Pt is to follow up with primary for complete pe  Final Clinical Impressions(s) / ED Diagnoses   Final diagnoses:  Bad headache    ED Discharge Orders        Ordered    SUMAtriptan (IMITREX) 100 MG tablet  Every 2 hours PRN      11/17/17 1528       Osie CheeksSofia, Dexton Zwilling K, PA-C 11/17/17 1846    Loren RacerYelverton, David, MD 11/17/17 2150

## 2017-11-17 NOTE — ED Triage Notes (Signed)
Pt reports hx of migraines but having severe headache with dizziness since Tuesday. Denies n/v.

## 2017-11-20 DIAGNOSIS — G43909 Migraine, unspecified, not intractable, without status migrainosus: Secondary | ICD-10-CM

## 2017-11-20 HISTORY — DX: Migraine, unspecified, not intractable, without status migrainosus: G43.909

## 2018-01-23 ENCOUNTER — Encounter (HOSPITAL_COMMUNITY): Payer: Self-pay

## 2018-01-23 ENCOUNTER — Emergency Department (HOSPITAL_COMMUNITY)
Admission: EM | Admit: 2018-01-23 | Discharge: 2018-01-23 | Disposition: A | Discharge status: Home / Self Care | Payer: 59 | Attending: Emergency Medicine | Admitting: Emergency Medicine

## 2018-01-23 ENCOUNTER — Emergency Department (HOSPITAL_COMMUNITY): Payer: 59

## 2018-01-23 ENCOUNTER — Other Ambulatory Visit: Payer: Self-pay

## 2018-01-23 DIAGNOSIS — Y939 Activity, unspecified: Secondary | ICD-10-CM | POA: Insufficient documentation

## 2018-01-23 DIAGNOSIS — S0083XA Contusion of other part of head, initial encounter: Secondary | ICD-10-CM | POA: Diagnosis not present

## 2018-01-23 DIAGNOSIS — W2209XA Striking against other stationary object, initial encounter: Secondary | ICD-10-CM | POA: Insufficient documentation

## 2018-01-23 DIAGNOSIS — Y999 Unspecified external cause status: Secondary | ICD-10-CM | POA: Insufficient documentation

## 2018-01-23 DIAGNOSIS — Y929 Unspecified place or not applicable: Secondary | ICD-10-CM | POA: Diagnosis not present

## 2018-01-23 DIAGNOSIS — S0990XA Unspecified injury of head, initial encounter: Secondary | ICD-10-CM

## 2018-01-23 MED ORDER — IBUPROFEN 400 MG PO TABS
600.0000 mg | ORAL_TABLET | Freq: Once | ORAL | Status: AC
  Administered 2018-01-23: 600 mg via ORAL
  Filled 2018-01-23: qty 1

## 2018-01-23 MED ORDER — IBUPROFEN 600 MG PO TABS: 600.0000 mg | ORAL_TABLET | Freq: Four times a day (QID) | ORAL | 0 refills | Status: DC | PRN

## 2018-01-23 NOTE — ED Notes (Signed)
ED Provider at bedside. 

## 2018-01-23 NOTE — ED Provider Notes (Signed)
[facility] EMERGENCY DEPARTMENT Provider Note   CSN: [id number] Arrival date & time: 01/23/18  1256     History   Chief Complaint Chief Complaint  Patient presents with  . Head Injury    HPI Jasmine Daugherty is a 22 y.o. female.  The history is provided by the patient. No language interpreter was used.  Head Injury   The incident occurred 1 to 2 hours ago. She came to the ER via walk-in. The injury mechanism was a direct blow. There was no loss of consciousness. There was no blood loss. The quality of the pain is described as dull. The pain is at a severity of 5/10. The pain is moderate. The pain has been constant since the injury. Associated symptoms include disorientation. Pertinent negatives include no vomiting. She has tried nothing for the symptoms. The treatment provided no relief.   Pt complains of hitting her head on a counter.  Pt reports she has a bad headache.  Pt reports she dizzy, vision went black  History reviewed. No pertinent past medical history.  There are no active problems to display for this patient.   History reviewed. No pertinent surgical history.   OB History   None      Home Medications    Prior to Admission medications   Medication Sig Start Date End Date Taking? Authorizing Provider  acetaminophen (TYLENOL) 500 MG tablet Take 500 mg by mouth every 6 (six) hours as needed for headache.   Yes [provider]  ibuprofen (ADVIL,MOTRIN) 100 MG tablet Take 100 mg by mouth every 6 (six) hours as needed (headaches).   Yes [provider]  SUMAtriptan (IMITREX) 100 MG tablet Take 1 tablet (100 mg total) by mouth every 2 (two) hours as needed for migraine. May repeat in 2 hours if headache persists or recurs. 11/17/17  Yes Elson Areas, PA-C    Family History No family history on file.  Social History Social History   Tobacco Use  . Smoking status: Never Smoker  . Smokeless tobacco: Never Used  Substance  Use Topics  . Alcohol use: No  . Drug use: No     Allergies   Patient has no known allergies.   Review of Systems Review of Systems  Gastrointestinal: Negative for vomiting.  All other systems reviewed and are negative.    Physical Exam Updated Vital Signs BP 118/70 (BP Location: Right Arm)   Pulse 78   Temp 98.3 F (36.8 C) (Oral)   Resp 18   LMP 12/31/2017 (Exact Date)   SpO2 99%   Physical Exam  Constitutional: She is oriented to person, place, and time. She appears well-developed and well-nourished.  HENT:  Head: Normocephalic and atraumatic.  Right Ear: External ear normal.  Left Ear: External ear normal.  Mouth/Throat: Oropharynx is clear and moist.  Eyes: Pupils are equal, round, and reactive to light. Conjunctivae and EOM are normal.  Neck: Normal range of motion.  Cardiovascular: Normal rate.  Pulmonary/Chest: Effort normal.  Abdominal: She exhibits no distension.  Musculoskeletal: Normal range of motion.  Neurological: She is alert and oriented to person, place, and time.  Psychiatric: She has a normal mood and affect.  Nursing note and vitals reviewed.    ED Treatments / Results  Labs (all labs ordered are listed, but only abnormal results are displayed) Labs Reviewed - No data to display  EKG None  Radiology Ct Head Wo Contrast  Result Date: 01/23/2018 CLINICAL DATA:  The  patient struck her head on a metal cart today. Initial encounter. EXAM: CT HEAD WITHOUT CONTRAST TECHNIQUE: Contiguous axial images were obtained from the base of the skull through the vertex without intravenous contrast. COMPARISON:  Head CT scan 11/17/2017. FINDINGS: Brain: No evidence of acute infarction, hemorrhage, hydrocephalus, extra-axial collection or mass lesion/mass effect. Vascular: No hyperdense vessel or unexpected calcification. Skull: Normal. Negative for fracture or focal lesion. Sinuses/Orbits: No acute finding. Other: None. IMPRESSION: Normal head CT.  Electronically Signed   By: Drusilla Kannerhomas  Dalessio M.D.   On: 01/23/2018 15:57    Procedures Procedures (including critical care time)  Medications Ordered in ED Medications - No data to display   Initial Impression / Assessment and Plan / ED Course  I have reviewed the triage vital signs and the nursing notes.  Pertinent labs & imaging results that were available during my care of the patient were reviewed by me and considered in my medical decision making (see chart for details).     Ct normal.  Pt given ibuprofen,  Rx for ibuprofen   Final Clinical Impressions(s) / ED Diagnoses   Final diagnoses:  Minor head injury, initial encounter  Contusion of other part of head, initial encounter    ED Discharge Orders        Ordered    ibuprofen (ADVIL,MOTRIN) 600 MG tablet  Every 6 hours PRN     01/23/18 1608    An After Visit Summary was printed and given to the patient.    Osie CheeksSofia, Gizel Riedlinger K, PA-C 01/23/18 1608    Lorre NickAllen, Anthony, MD 01/25/18 1014

## 2018-01-23 NOTE — ED Triage Notes (Signed)
Pt presents for evaluation of head injury today at work. Pt hit her head on corner of metal cart. States head is hurting at this time. Pt was initially dizzy after standing when she got hit. No LOC.

## 2018-01-23 NOTE — Discharge Instructions (Addendum)
Return if any problems.  Ice to area of pain  °

## 2018-03-28 ENCOUNTER — Emergency Department (HOSPITAL_COMMUNITY)
Admission: EM | Admit: 2018-03-28 | Discharge: 2018-03-28 | Disposition: A | Discharge status: Home / Self Care | Payer: 59 | Attending: Emergency Medicine | Admitting: Emergency Medicine

## 2018-03-28 ENCOUNTER — Encounter (HOSPITAL_COMMUNITY): Payer: Self-pay | Admitting: Emergency Medicine

## 2018-03-28 ENCOUNTER — Emergency Department (HOSPITAL_COMMUNITY): Payer: 59

## 2018-03-28 DIAGNOSIS — R064 Hyperventilation: Secondary | ICD-10-CM | POA: Diagnosis not present

## 2018-03-28 DIAGNOSIS — Z79899 Other long term (current) drug therapy: Secondary | ICD-10-CM | POA: Diagnosis not present

## 2018-03-28 DIAGNOSIS — R0602 Shortness of breath: Secondary | ICD-10-CM

## 2018-03-28 DIAGNOSIS — J45909 Unspecified asthma, uncomplicated: Secondary | ICD-10-CM | POA: Diagnosis not present

## 2018-03-28 HISTORY — DX: Unspecified asthma, uncomplicated: J45.909

## 2018-03-28 LAB — BASIC METABOLIC PANEL
Anion gap: 12 (ref 5–15)
BUN: 11 mg/dL (ref 6–20)
CO2: 20 mmol/L — ABNORMAL LOW (ref 22–32)
Calcium: 9.3 mg/dL (ref 8.9–10.3)
Chloride: 106 mmol/L (ref 98–111)
Creatinine, Ser: 0.79 mg/dL (ref 0.44–1.00)
GFR calc Af Amer: >60 mL/min (ref >60)
GFR calc non Af Amer: >60 mL/min (ref >60)
Glucose, Bld: 115 mg/dL — ABNORMAL HIGH (ref 70–99)
Potassium: 3.8 mmol/L (ref 3.5–5.1)
Sodium: 138 mmol/L (ref 135–145)

## 2018-03-28 LAB — CBC
HCT: 42.2 % (ref 36.0–46.0)
Hemoglobin: 13.5 g/dL (ref 12.0–15.0)
MCH: 30 pg (ref 26.0–34.0)
MCHC: 32 g/dL (ref 30.0–36.0)
MCV: 93.8 fL (ref 78.0–100.0)
Platelets: 339 10*3/uL (ref 150–400)
RBC: 4.5 MIL/uL (ref 3.87–5.11)
RDW: 12.3 % (ref 11.5–15.5)
WBC: 8.2 10*3/uL (ref 4.0–10.5)

## 2018-03-28 LAB — I-STAT BETA HCG BLOOD, ED (MC, WL, AP ONLY): I-stat hCG, quantitative: <5 m[IU]/mL (ref <5)

## 2018-03-28 LAB — I-STAT TROPONIN, ED: Troponin i, poc: 0 ng/mL (ref 0.00–0.08)

## 2018-03-28 MED ORDER — PREDNISONE 20 MG PO TABS
60.0000 mg | ORAL_TABLET | Freq: Once | ORAL | Status: AC
  Administered 2018-03-28: 60 mg via ORAL
  Filled 2018-03-28: qty 3

## 2018-03-28 MED ORDER — HYDROMORPHONE HCL 1 MG/ML IJ SOLN
0.5000 mg | Freq: Once | INTRAMUSCULAR | Status: AC
  Administered 2018-03-28: 0.5 mg via INTRAVENOUS
  Filled 2018-03-28: qty 1

## 2018-03-28 MED ORDER — ALBUTEROL SULFATE HFA 108 (90 BASE) MCG/ACT IN AERS
2.0000 | INHALATION_SPRAY | Freq: Four times a day (QID) | RESPIRATORY_TRACT | Status: DC
  Administered 2018-03-28: 2 via RESPIRATORY_TRACT
  Filled 2018-03-28: qty 6.7

## 2018-03-28 MED ORDER — IPRATROPIUM-ALBUTEROL 0.5-2.5 (3) MG/3ML IN SOLN
3.0000 mL | Freq: Once | RESPIRATORY_TRACT | Status: AC
  Administered 2018-03-28: 3 mL via RESPIRATORY_TRACT
  Filled 2018-03-28: qty 3

## 2018-03-28 MED ORDER — LORAZEPAM 2 MG/ML IJ SOLN
1.0000 mg | Freq: Once | INTRAMUSCULAR | Status: AC
  Administered 2018-03-28: 1 mg via INTRAVENOUS
  Filled 2018-03-28: qty 1

## 2018-03-28 MED ORDER — ONDANSETRON HCL 4 MG/2ML IJ SOLN
4.0000 mg | Freq: Once | INTRAMUSCULAR | Status: AC
  Administered 2018-03-28: 4 mg via INTRAVENOUS
  Filled 2018-03-28: qty 2

## 2018-03-28 MED ORDER — PREDNISONE 10 MG PO TABS: 40.0000 mg | ORAL_TABLET | Freq: Every day | ORAL | 0 refills | Status: DC

## 2018-03-28 NOTE — ED Provider Notes (Signed)
[facility] EMERGENCY DEPARTMENT Provider Note   CSN: [id number] Arrival date & time: 03/28/18  4098     History   Chief Complaint Chief Complaint  Patient presents with  . Shortness of Breath    HPI Jasmine Daugherty is a 22 y.o. female.  Patient is an employer here.  Patient with an acute onset of shortness of breath feeling like she could not get air and some anterior chest pain.  Patient states that she has had an upper restaurant infection and that this is happened before when she had bronchitis.  Patient also has an old history of asthma.  Patient stated that she felt as if there was some wheezing going on.  Patient brought back hyperventilating in obvious respiratory distress but oxygen saturations were 100%.  No stridor no wheezing to listening  to her lungs.  Patient denies any leg swelling.  Clinically appears as if it could be a panic attack the patient does not have a history of that.     Past Medical History:  Diagnosis Date  . Asthma     There are no active problems to display for this patient.   History reviewed. No pertinent surgical history.   OB History   None      Home Medications    Prior to Admission medications   Medication Sig Start Date End Date Taking? Authorizing Provider  ibuprofen (ADVIL,MOTRIN) 600 MG tablet Take 1 tablet (600 mg total) by mouth every 6 (six) hours as needed. Patient taking differently: Take 800 mg by mouth every 6 (six) hours as needed.  01/23/18  Yes Cheron Schaumann K, PA-C  OVER THE COUNTER MEDICATION Take 1 tablet by mouth at bedtime. Over the counter Thyroid supplement   Yes [provider]  predniSONE (DELTASONE) 10 MG tablet Take 4 tablets (40 mg total) by mouth daily. 03/28/18   Vanetta Mulders, MD  SUMAtriptan (IMITREX) 100 MG tablet Take 1 tablet (100 mg total) by mouth every 2 (two) hours as needed for migraine. May repeat in 2 hours if headache persists or recurs. Patient not taking:  Reported on 03/28/2018 11/17/17   Osie Cheeks    Family History No family history on file.  Social History Social History   Tobacco Use  . Smoking status: Never Smoker  . Smokeless tobacco: Never Used  Substance Use Topics  . Alcohol use: No  . Drug use: No     Allergies   Tree extract   Review of Systems Review of Systems  Constitutional: Negative for fever.  HENT: Positive for congestion.   Eyes: Negative for visual disturbance.  Respiratory: Positive for cough, shortness of breath and wheezing.   Cardiovascular: Positive for chest pain. Negative for leg swelling.  Gastrointestinal: Negative for abdominal pain.  Genitourinary: Negative for dysuria.  Musculoskeletal: Negative for back pain.  Neurological: Negative for syncope.  Hematological: Does not bruise/bleed easily.  Psychiatric/Behavioral: Negative for confusion.     Physical Exam Updated Vital Signs BP 106/61   Pulse (!) 102   Temp 98.6 F (37 C) (Oral)   Resp (!) 22   Ht 1.702 m (5\' 7" )   Wt 127 kg (280 lb)   LMP 03/21/2018   SpO2 100%   BMI 43.85 kg/m   Physical Exam  Constitutional: She is oriented to person, place, and time. She appears well-developed and well-nourished. She appears distressed.  HENT:  Head: Normocephalic and atraumatic.  Mouth/Throat: Oropharynx is clear and moist.  Eyes: Pupils  are equal, round, and reactive to light. Conjunctivae and EOM are normal.  Neck: Neck supple.  Cardiovascular: Normal rate and regular rhythm.  Pulmonary/Chest: No stridor. She is in respiratory distress. She has wheezes. She has no rales.  Abdominal: Soft. Bowel sounds are normal. There is no tenderness.  Musculoskeletal: Normal range of motion. She exhibits no edema.  Neurological: She is alert and oriented to person, place, and time. No cranial nerve deficit or sensory deficit. She exhibits normal muscle tone. Coordination normal.  Skin: Skin is warm. No rash noted.     ED Treatments  / Results  Labs (all labs ordered are listed, but only abnormal results are displayed) Labs Reviewed  BASIC METABOLIC PANEL - Abnormal; Notable for the following components:      Result Value   CO2 20 (*)    Glucose, Bld 115 (*)    All other components within normal limits  CBC  I-STAT TROPONIN, ED  I-STAT BETA HCG BLOOD, ED ([facility], [facility], [facility] ONLY)    EKG EKG Interpretation  Date/Time:  Thursday March 28 2018 08:41:37 EDT Ventricular Rate:  107 PR Interval:    QRS Duration: 81 QT Interval:  334 QTC Calculation: 446 R Axis:   44 Text Interpretation:  Sinus tachycardia RSR' in V1 or V2, right VCD or RVH Baseline wander in lead(s) V6 Interpretation limited secondary to artifact Confirmed by Vanetta MuldersZackowski, Jlynn Langille [phone]([id number]) on 03/28/2018 8:50:36 AM   Radiology Dg Chest Port 1 View  Result Date: 03/28/2018 CLINICAL DATA:  Shortness of breath. EXAM: PORTABLE CHEST 1 VIEW COMPARISON:  Radiographs of August 17, 2017. FINDINGS: The heart size and mediastinal contours are within normal limits. No pneumothorax or pleural effusion is noted. Hypoinflation of the lungs is noted with minimal bibasilar subsegmental atelectasis. The visualized skeletal structures are unremarkable. IMPRESSION: Hypoinflation of the lungs is noted with minimal bibasilar subsegmental atelectasis. Electronically Signed   By: Lupita RaiderJames  Green Jr, M.D.   On: 03/28/2018 09:16    Procedures Procedures (including critical care time)  Medications Ordered in ED Medications  albuterol (PROVENTIL HFA;VENTOLIN HFA) 108 (90 Base) MCG/ACT inhaler 2 puff (has no administration in time range)  ipratropium-albuterol (DUONEB) 0.5-2.5 (3) MG/3ML nebulizer solution 3 mL (3 mLs Nebulization Given 03/28/18 0856)  LORazepam (ATIVAN) injection 1 mg (1 mg Intravenous Given 03/28/18 0857)  HYDROmorphone (DILAUDID) injection 0.5 mg (0.5 mg Intravenous Given 03/28/18 1153)  ondansetron (ZOFRAN) injection 4 mg (4 mg Intravenous Given 03/28/18 1151)  predniSONE  (DELTASONE) tablet 60 mg (60 mg Oral Given 03/28/18 1155)  ipratropium-albuterol (DUONEB) 0.5-2.5 (3) MG/3ML nebulizer solution 3 mL (3 mLs Nebulization Given 03/28/18 1156)     Initial Impression / Assessment and Plan / ED Course  I have reviewed the triage vital signs and the nursing notes.  Pertinent labs & imaging results that were available during my care of the patient were reviewed by me and considered in my medical decision making (see chart for details).    Patient with significant improvement compared how she presented.  Patient still feels as if she has some tightness in her chest and some shortness of breath she is saturations have been upper 90s to 100% consistently.  Work-up here without any significant abnormalities.  Initial presentation appeared to be consistent with a panic attack.  Patient was significant improvement with Ativan.  Patient with some improvement in breathing with the albuterol Atrovent nebulizers.  Patient no longer in any respiratory distress.  Heart rate still occasionally tachycardic.  Do not think  that this is a pulmonary embolus because of the way she presented.  Patient has had this happen before.  Patient stated it felt as if she was having asthma problems but it was not confirmed by ever hearing any wheezing.  Patient will be treated with a course of prednisone for 5 days continue her albuterol inhaler.  She will return for any new or worse symptoms.  Also not consistent with an acute coronary event.   Final Clinical Impressions(s) / ED Diagnoses   Final diagnoses:  SOB (shortness of breath)  Acute hyperventilation    ED Discharge Orders        Ordered    predniSONE (DELTASONE) 10 MG tablet  Daily     03/28/18 1330       Vanetta Mulders, MD 03/28/18 1341

## 2018-03-28 NOTE — ED Notes (Signed)
Got patient undress on the monitor did ekg shown to er doctor patient is resting with call bell in reach 

## 2018-03-28 NOTE — ED Triage Notes (Signed)
Patient was working and began feeling SOB breath and having central chest pain with no radiation. Patient reports hx of asthma. Room air sats 100 %.

## 2018-03-28 NOTE — Discharge Instructions (Addendum)
Use the albuterol inhaler 2 puffs every 6 hours for the next 7 days.  Take the prednisone as directed for the next 5 days.  Work note provided.  Return for any new or worse symptoms.

## 2018-09-13 ENCOUNTER — Emergency Department (HOSPITAL_COMMUNITY): Payer: 59

## 2018-09-13 ENCOUNTER — Emergency Department (HOSPITAL_COMMUNITY)
Admission: EM | Admit: 2018-09-13 | Discharge: 2018-09-14 | Discharge status: Against Medical Advice | Payer: 59 | Attending: Emergency Medicine | Admitting: Emergency Medicine

## 2018-09-13 DIAGNOSIS — Z5321 Procedure and treatment not carried out due to patient leaving prior to being seen by health care provider: Secondary | ICD-10-CM | POA: Insufficient documentation

## 2018-09-13 DIAGNOSIS — R0789 Other chest pain: Secondary | ICD-10-CM | POA: Diagnosis present

## 2018-09-13 LAB — BASIC METABOLIC PANEL
Anion gap: 11 (ref 5–15)
BUN: <5 mg/dL — ABNORMAL LOW (ref 6–20)
CO2: 24 mmol/L (ref 22–32)
Calcium: 8.8 mg/dL — ABNORMAL LOW (ref 8.9–10.3)
Chloride: 103 mmol/L (ref 98–111)
Creatinine, Ser: 0.83 mg/dL (ref 0.44–1.00)
GFR calc Af Amer: >60 mL/min (ref >60)
GFR calc non Af Amer: >60 mL/min (ref >60)
Glucose, Bld: 108 mg/dL — ABNORMAL HIGH (ref 70–99)
Potassium: 3.7 mmol/L (ref 3.5–5.1)
Sodium: 138 mmol/L (ref 135–145)

## 2018-09-13 LAB — CBC
HCT: 37.7 % (ref 36.0–46.0)
Hemoglobin: 11.8 g/dL — ABNORMAL LOW (ref 12.0–15.0)
MCH: 29.6 pg (ref 26.0–34.0)
MCHC: 31.3 g/dL (ref 30.0–36.0)
MCV: 94.7 fL (ref 80.0–100.0)
Platelets: 269 10*3/uL (ref 150–400)
RBC: 3.98 MIL/uL (ref 3.87–5.11)
RDW: 12.6 % (ref 11.5–15.5)
WBC: 5.1 10*3/uL (ref 4.0–10.5)
nRBC: 0 % (ref 0.0–0.2)

## 2018-09-13 LAB — I-STAT BETA HCG BLOOD, ED (MC, WL, AP ONLY): I-stat hCG, quantitative: <5 m[IU]/mL (ref <5)

## 2018-09-13 LAB — I-STAT TROPONIN, ED: Troponin i, poc: 0 ng/mL (ref 0.00–0.08)

## 2018-09-13 NOTE — ED Triage Notes (Signed)
Pt presents with central burning chest pain since yesterday. Has a dx of bronchitis but feels this is unreleated. Feels "hot"

## 2018-09-14 ENCOUNTER — Encounter (HOSPITAL_COMMUNITY): Payer: Self-pay

## 2018-09-14 ENCOUNTER — Other Ambulatory Visit: Payer: Self-pay

## 2018-09-14 ENCOUNTER — Emergency Department (HOSPITAL_COMMUNITY)
Admission: EM | Admit: 2018-09-14 | Discharge: 2018-09-14 | Disposition: A | Discharge status: Home / Self Care | Payer: 59 | Source: Home / Self Care | Attending: Emergency Medicine | Admitting: Emergency Medicine

## 2018-09-14 DIAGNOSIS — J111 Influenza due to unidentified influenza virus with other respiratory manifestations: Secondary | ICD-10-CM

## 2018-09-14 DIAGNOSIS — R69 Illness, unspecified: Principal | ICD-10-CM

## 2018-09-14 MED ORDER — IBUPROFEN 600 MG PO TABS: 600.0000 mg | ORAL_TABLET | Freq: Four times a day (QID) | ORAL | 0 refills | Status: DC | PRN

## 2018-09-14 MED ORDER — OSELTAMIVIR PHOSPHATE 75 MG PO CAPS: 75.0000 mg | ORAL_CAPSULE | Freq: Two times a day (BID) | ORAL | 0 refills | Status: DC

## 2018-09-14 NOTE — ED Notes (Signed)
Pt up to this EMT stating that [facility].

## 2018-09-14 NOTE — ED Triage Notes (Signed)
Pt states she was at work today and noticed she was starting to get weak and diaphoretic. She says she felt hot but was having chills. Pt woke up with a burning chest pain 12/13 around am but went on to work anyway. Pt sounds congested in triage.

## 2018-09-14 NOTE — ED Provider Notes (Signed)
[facility]-EMERGENCY DEPT Provider Note   CSN: [id number] Arrival date & time: 09/14/18  0350     History   Chief Complaint Chief Complaint  Patient presents with  . Fever  . Chills    HPI Jasmine Daugherty is a 22 y.o. female.  The history is provided by the patient. No language interpreter was used.  Fever       22 year old female presenting with cold symptoms.  Patient report having fever, chills, body aches, sinus congestion, chest congestion, nonproductive cough, feeling weak since yesterday.  States that started during her shift at work and when she finished she went to the ER for help.  She denies any specific treatment tried.  She felt like she may have caught a cold.  She denies severe headache, neck stiffness, nausea vomiting diarrhea light or sound sensitivity or dysuria.  She did not have a flu shot.  She complained of burning sensation in her chest when she coughs.  She feels congested.  Past Medical History:  Diagnosis Date  . Asthma     There are no active problems to display for this patient.   History reviewed. No pertinent surgical history.   OB History   No obstetric history on file.      Home Medications    Prior to Admission medications   Medication Sig Start Date End Date Taking? Authorizing Provider  ibuprofen (ADVIL,MOTRIN) 600 MG tablet Take 1 tablet (600 mg total) by mouth every 6 (six) hours as needed. Patient taking differently: Take 800 mg by mouth every 6 (six) hours as needed.  01/23/18   Elson AreasSofia, Leslie K, PA-C  OVER THE COUNTER MEDICATION Take 1 tablet by mouth at bedtime. Over the counter Thyroid supplement    [provider]  predniSONE (DELTASONE) 10 MG tablet Take 4 tablets (40 mg total) by mouth daily. 03/28/18   Vanetta MuldersZackowski, Scott, MD  SUMAtriptan (IMITREX) 100 MG tablet Take 1 tablet (100 mg total) by mouth every 2 (two) hours as needed for migraine. May repeat in 2 hours if headache persists or  recurs. Patient not taking: Reported on 03/28/2018 11/17/17   Osie CheeksSofia, Leslie K, PA-C    Family History History reviewed. No pertinent family history.  Social History Social History   Tobacco Use  . Smoking status: Never Smoker  . Smokeless tobacco: Never Used  Substance Use Topics  . Alcohol use: No  . Drug use: No     Allergies   Tree extract   Review of Systems Review of Systems  Constitutional: Positive for fever.  All other systems reviewed and are negative.    Physical Exam Updated Vital Signs BP (!) 164/105   Pulse (!) 108   Temp 98.4 F (36.9 C) (Oral)   Resp 16   Ht 5' 7.5" (1.715 m)   Wt 127 kg   LMP 08/30/2018   SpO2 100%   BMI 43.21 kg/m   Physical Exam Vitals signs and nursing note reviewed.  Constitutional:      General: She is not in acute distress.    Appearance: She is well-developed.  HENT:     Head: Atraumatic.     Comments: Ears: Normal TMs bilaterally Nose: Rhinorrhea Throat: Uvula midline no tonsillar edema no exudates, no trismus Eyes:     Conjunctiva/sclera: Conjunctivae normal.  Neck:     Musculoskeletal: Neck supple. No neck rigidity.  Cardiovascular:     Rate and Rhythm: Normal rate and regular rhythm.  Pulmonary:  Effort: Pulmonary effort is normal.     Breath sounds: Normal breath sounds. No wheezing, rhonchi or rales.  Abdominal:     Palpations: Abdomen is soft.     Tenderness: There is no abdominal tenderness.  Skin:    Findings: No rash.  Neurological:     Mental Status: She is alert and oriented to person, place, and time.      ED Treatments / Results  Labs (all labs ordered are listed, but only abnormal results are displayed) Labs Reviewed - No data to display  EKG None  Radiology Dg Chest 2 View  Result Date: 09/13/2018 CLINICAL DATA:  Chest pain EXAM: CHEST - 2 VIEW COMPARISON:  03/28/2018 FINDINGS: The heart size and mediastinal contours are within normal limits. Both lungs are clear. The  visualized skeletal structures are unremarkable. IMPRESSION: No active cardiopulmonary disease. Electronically Signed   By: Jasmine Pang M.D.   On: 09/13/2018 22:47    Procedures Procedures (including critical care time)  Medications Ordered in ED Medications - No data to display   Initial Impression / Assessment and Plan / ED Course  I have reviewed the triage vital signs and the nursing notes.  Pertinent labs & imaging results that were available during my care of the patient were reviewed by me and considered in my medical decision making (see chart for details).     BP (!) 164/105   Pulse (!) 108   Temp 98.4 F (36.9 C) (Oral)   Resp 16   Ht 5' 7.5" (1.715 m)   Wt 127 kg   LMP 08/30/2018   SpO2 100%   BMI 43.21 kg/m    Final Clinical Impressions(s) / ED Diagnoses   Final diagnoses:  Influenza-like illness    ED Discharge Orders         Ordered    oseltamivir (TAMIFLU) 75 MG capsule  Every 12 hours     09/14/18 0729    ibuprofen (ADVIL,MOTRIN) 600 MG tablet  Every 6 hours PRN     09/14/18 0729         Patient with symptoms consistent with influenza.  Vitals are stable, low-grade fever.  No signs of dehydration, tolerating PO's.  Lungs are clear. Due to patient's presentation and physical exam a chest x-ray was not ordered bc likely diagnosis of flu.  Discussed the cost versus benefit of Tamiflu treatment with the patient.  The patient understands that symptoms are greater than the recommended 24-48 hour window of treatment.  Patient will be discharged with instructions to orally hydrate, rest, and use over-the-counter medications such as anti-inflammatories ibuprofen and Aleve for muscle aches and Tylenol for fever.  Patient will also be given a cough suppressant.      Fayrene Helper, PA-C 09/14/18 1610    Margarita Grizzle, MD 09/26/18 [phone]

## 2019-07-03 ENCOUNTER — Other Ambulatory Visit: Payer: Self-pay

## 2019-07-03 DIAGNOSIS — Z20822 Contact with and (suspected) exposure to covid-19: Secondary | ICD-10-CM

## 2019-07-04 LAB — NOVEL CORONAVIRUS, NAA: SARS-CoV-2, NAA: NOT DETECTED

## 2019-09-10 ENCOUNTER — Other Ambulatory Visit: Payer: Self-pay

## 2019-09-10 DIAGNOSIS — Z20822 Contact with and (suspected) exposure to covid-19: Secondary | ICD-10-CM

## 2019-09-12 LAB — NOVEL CORONAVIRUS, NAA: SARS-CoV-2, NAA: NOT DETECTED

## 2019-10-15 ENCOUNTER — Emergency Department (HOSPITAL_COMMUNITY)
Admission: EM | Admit: 2019-10-15 | Discharge: 2019-10-15 | Disposition: A | Discharge status: Home / Self Care | Payer: BC Managed Care – PPO | Attending: Emergency Medicine | Admitting: Emergency Medicine

## 2019-10-15 ENCOUNTER — Encounter (HOSPITAL_COMMUNITY): Payer: Self-pay

## 2019-10-15 ENCOUNTER — Emergency Department (HOSPITAL_COMMUNITY): Payer: BC Managed Care – PPO

## 2019-10-15 ENCOUNTER — Other Ambulatory Visit: Payer: Self-pay

## 2019-10-15 DIAGNOSIS — R059 Cough, unspecified: Secondary | ICD-10-CM

## 2019-10-15 DIAGNOSIS — J45909 Unspecified asthma, uncomplicated: Secondary | ICD-10-CM | POA: Insufficient documentation

## 2019-10-15 DIAGNOSIS — Z20822 Contact with and (suspected) exposure to covid-19: Secondary | ICD-10-CM

## 2019-10-15 DIAGNOSIS — R05 Cough: Secondary | ICD-10-CM

## 2019-10-15 DIAGNOSIS — U071 COVID-19: Secondary | ICD-10-CM | POA: Diagnosis not present

## 2019-10-15 DIAGNOSIS — B349 Viral infection, unspecified: Secondary | ICD-10-CM

## 2019-10-15 MED ORDER — BENZONATATE 100 MG PO CAPS: 100.0000 mg | ORAL_CAPSULE | Freq: Three times a day (TID) | ORAL | 0 refills | Status: DC

## 2019-10-15 NOTE — ED Triage Notes (Signed)
Patient c/o "hacky" cough since last night. Patient also c/o headache and dizziness that started today.

## 2019-10-15 NOTE — ED Provider Notes (Addendum)
[facility]-EMERGENCY DEPT Provider Note   CSN: [id number] Arrival date & time: 10/15/19  1722     History Chief Complaint  Patient presents with  . Dizziness  . Cough  . Headache    Jasmine Daugherty is a 24 y.o. female with PMHx asthma who presents to the ED today complaining of gradual onset, constant, dry cough x 1 day. Pt also complains of a frontal headache and a sore throat from cough. 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 She is concern she could have been exposed at work. Pt has not taken anything for her symptoms. On arrival to the ED her temperature was noted to be 99.9; pt did not know she had a slightly elevated temp however she reports she has also had chills. Denies abdominal pain, nausea, vomiting, diarrhea, chest pain, SOB, vision changes, neck stiffness, rash, or any other associated symptoms.   The history is provided by the patient.       Past Medical History:  Diagnosis Date  . Asthma     There are no problems to display for this patient.   History reviewed. No pertinent surgical history.   OB History   No obstetric history on file.     History reviewed. No pertinent family history.  Social History   Tobacco Use  . Smoking status: Never Smoker  . Smokeless tobacco: Never Used  Substance Use Topics  . Alcohol use: No  . Drug use: No    Home Medications Prior to Admission medications   Medication Sig Start Date End Date Taking? Authorizing Provider  benzonatate (TESSALON) 100 MG capsule Take 1 capsule (100 mg total) by mouth every 8 (eight) hours. 10/15/19   Hyman Hopes, Shenice Dolder, PA-C  ibuprofen  (ADVIL,MOTRIN) 600 MG tablet Take 1 tablet (600 mg total) by mouth every 6 (six) hours as needed. 09/14/18   Fayrene Helper, PA-C  oseltamivir (TAMIFLU) 75 MG capsule Take 1 capsule (75 mg total) by mouth every 12 (twelve) hours. 09/14/18   Fayrene Helper, PA-C  OVER THE COUNTER MEDICATION Take 1 tablet by mouth at bedtime. Over the counter Thyroid supplement    [provider]  predniSONE (DELTASONE) 10 MG tablet Take 4 tablets (40 mg total) by mouth daily. 03/28/18   Vanetta Mulders, MD  SUMAtriptan (IMITREX) 100 MG tablet Take 1 tablet (100 mg total) by mouth every 2 (two) hours as needed for migraine. May repeat in 2 hours if headache persists or recurs. Patient not taking: Reported on 03/28/2018 11/17/17   Elson Areas, PA-C    Allergies    Tree extract  Review of Systems   Review of Systems  Constitutional: Positive for chills, fatigue and fever.  HENT: Positive for sore throat.   Respiratory: Positive for cough. Negative for shortness of breath.   Gastrointestinal: Negative for abdominal pain, nausea and vomiting.    Physical Exam Updated Vital Signs BP 139/89 (BP Location: Left Arm)   Pulse 97   Temp 99.9 F (37.7 C) (Oral)   Resp 18   Ht 5\' 8"  (1.727 m)   Wt (!) 140.6 kg   LMP 10/15/2018   SpO2 97%  BMI 47.14 kg/m   Physical Exam Vitals and nursing note reviewed.  Constitutional:      Appearance: She is not ill-appearing.  HENT:     Head: Normocephalic and atraumatic.  Eyes:     Conjunctiva/sclera: Conjunctivae normal.  Cardiovascular:     Rate and Rhythm: Normal rate and regular rhythm.     Heart sounds: Normal heart sounds.  Pulmonary:     Effort: Pulmonary effort is normal.     Breath sounds: Normal breath sounds. No wheezing, rhonchi or rales.  Chest:     Chest wall: No tenderness.  Skin:    General: Skin is warm and dry.     Coloration: Skin is not jaundiced.  Neurological:     Mental Status: She is alert and oriented to person, place, and time.      ED Results / Procedures / Treatments   Labs (all labs ordered are listed, but only abnormal results are displayed) Labs Reviewed  NOVEL CORONAVIRUS, NAA (HOSP ORDER, SEND-OUT TO REF LAB; TAT 18-24 HRS)    EKG None  Radiology DG Chest Port 1 View  Result Date: 10/15/2019 CLINICAL DATA:  24 year old female with cough. EXAM: PORTABLE CHEST 1 VIEW COMPARISON:  Chest radiograph dated 09/13/2018. FINDINGS: The heart size and mediastinal contours are within normal limits. Both lungs are clear. The visualized skeletal structures are unremarkable. IMPRESSION: No active disease. Electronically Signed   By: Elgie Collard M.D.   On: 10/15/2019 22:37    Procedures Procedures (including critical care time)  Medications Ordered in ED Medications - No data to display  ED Course  I have reviewed the triage vital signs and the nursing notes.  Pertinent labs & imaging results that were available during my care of the patient were reviewed by me and considered in my medical decision making (see chart for details).  24 year old female who presents the ED today complaining of cough, mild fever, headache for the past day.  Patient will COVID-19 positive exposure.  She states that multiple people at work have been out recently for it but she does not know if she was sitting near any of them.  Patient is in no acute distress on exam today.  Her temp is mildly elevated at 99.9.  She is nontachycardic and nontachypneic.  She is satting 97% on room air.  She is able to speak in full sentences without difficulty.  She does have a mild dry cough on exam today.  With history of asthma will obtain chest x-ray at this time.  Will do send out test for Covid.  Patient advised that she will need to self isolate until she receives her results.  Do not feel she needs additional lab work at this time.  X-ray clear.  Patient will be discharged and swab at the same time.  Will prescribe Tessalon Perles for  symptomatic relief.  Patient advised to self isolate until she receives her results.  If positive she will need to stay home for 10 days.  Work note has been given.  Patient advised to follow-up with her PCP regarding her symptoms.  Advised to take Tylenol as needed for fever.  Patient is in agreement with plan at this time and stable for discharge home.   This note was prepared using Dragon voice recognition software and may include unintentional dictation errors due to the inherent limitations of voice recognition software.  Jasmine Daugherty was evaluated in Emergency Department on 10/15/2019 for the symptoms described in the history of  present illness. She was evaluated in the context of the global COVID-19 pandemic, which necessitated consideration that the patient might be at risk for infection with the SARS-CoV-2 virus that causes COVID-19. Institutional protocols and algorithms that pertain to the evaluation of patients at risk for COVID-19 are in a state of rapid change based on information released by regulatory bodies including the CDC and federal and state organizations. These policies and algorithms were followed during the patient's care in the ED.    MDM Rules/Calculators/A&P                       Final Clinical Impression(s) / ED Diagnoses Final diagnoses:  Viral illness  Cough  Person under investigation for COVID-19    Rx / DC Orders ED Discharge Orders         Ordered    benzonatate (TESSALON) 100 MG capsule  Every 8 hours     10/15/19 2242              Eustaquio Maize, PA-C 10/15/19 2250    Carmin Muskrat, MD 10/15/19 2334

## 2019-10-15 NOTE — Discharge Instructions (Signed)
Your chest xray was clear today. We have swabbed you for COVID 19 - you will need to stay at home and self isolate until you receive your results. IF positive you will need to self isolate for 10 days starting today (cleared: 10/26/2019). If negative you may resume normal daily activity.   I have prescribed medication for coughing. You can also use your albuterol inhaler as needed.   Please continue monitoring your symptoms at home. Take Tylenol as needed for fevers > 100.4.   Please follow up with your PCP regarding your ED visit today and your COVID status once you receive your results.   Return to the ED for any worsening symptoms.

## 2019-10-16 ENCOUNTER — Other Ambulatory Visit: Payer: BC Managed Care – PPO

## 2019-10-17 ENCOUNTER — Other Ambulatory Visit: Payer: Self-pay

## 2019-10-17 ENCOUNTER — Encounter (HOSPITAL_COMMUNITY): Payer: Self-pay | Admitting: Emergency Medicine

## 2019-10-17 ENCOUNTER — Emergency Department (HOSPITAL_COMMUNITY): Payer: BC Managed Care – PPO

## 2019-10-17 ENCOUNTER — Emergency Department (HOSPITAL_COMMUNITY)
Admission: EM | Admit: 2019-10-17 | Discharge: 2019-10-17 | Disposition: A | Discharge status: Home / Self Care | Payer: BC Managed Care – PPO | Attending: Emergency Medicine | Admitting: Emergency Medicine

## 2019-10-17 DIAGNOSIS — Z79899 Other long term (current) drug therapy: Secondary | ICD-10-CM | POA: Insufficient documentation

## 2019-10-17 DIAGNOSIS — J45909 Unspecified asthma, uncomplicated: Secondary | ICD-10-CM | POA: Insufficient documentation

## 2019-10-17 DIAGNOSIS — U071 COVID-19: Secondary | ICD-10-CM | POA: Diagnosis not present

## 2019-10-17 DIAGNOSIS — R0602 Shortness of breath: Secondary | ICD-10-CM | POA: Diagnosis present

## 2019-10-17 LAB — CBC WITH DIFFERENTIAL/PLATELET
Abs Immature Granulocytes: 0.01 10*3/uL (ref 0.00–0.07)
Basophils Absolute: 0 10*3/uL (ref 0.0–0.1)
Basophils Relative: 1 %
Eosinophils Absolute: 0 10*3/uL (ref 0.0–0.5)
Eosinophils Relative: 1 %
HCT: 43.5 % (ref 36.0–46.0)
Hemoglobin: 14 g/dL (ref 12.0–15.0)
Immature Granulocytes: 0 %
Lymphocytes Relative: 40 %
Lymphs Abs: 1.6 10*3/uL (ref 0.7–4.0)
MCH: 30.4 pg (ref 26.0–34.0)
MCHC: 32.2 g/dL (ref 30.0–36.0)
MCV: 94.6 fL (ref 80.0–100.0)
Monocytes Absolute: 0.6 10*3/uL (ref 0.1–1.0)
Monocytes Relative: 15 %
Neutro Abs: 1.8 10*3/uL (ref 1.7–7.7)
Neutrophils Relative %: 43 %
Platelets: 323 10*3/uL (ref 150–400)
RBC: 4.6 MIL/uL (ref 3.87–5.11)
RDW: 12.7 % (ref 11.5–15.5)
WBC: 4 10*3/uL (ref 4.0–10.5)
nRBC: 0 % (ref 0.0–0.2)

## 2019-10-17 LAB — COMPREHENSIVE METABOLIC PANEL
ALT: 16 U/L (ref 0–44)
AST: 18 U/L (ref 15–41)
Albumin: 4.1 g/dL (ref 3.5–5.0)
Alkaline Phosphatase: 73 U/L (ref 38–126)
Anion gap: 9 (ref 5–15)
BUN: 8 mg/dL (ref 6–20)
CO2: 26 mmol/L (ref 22–32)
Calcium: 9.5 mg/dL (ref 8.9–10.3)
Chloride: 103 mmol/L (ref 98–111)
Creatinine, Ser: 0.76 mg/dL (ref 0.44–1.00)
GFR calc Af Amer: >60 mL/min (ref >60)
GFR calc non Af Amer: >60 mL/min (ref >60)
Glucose, Bld: 97 mg/dL (ref 70–99)
Potassium: 3.9 mmol/L (ref 3.5–5.1)
Sodium: 138 mmol/L (ref 135–145)
Total Bilirubin: 0.6 mg/dL (ref 0.3–1.2)
Total Protein: 7.8 g/dL (ref 6.5–8.1)

## 2019-10-17 LAB — I-STAT BETA HCG BLOOD, ED (MC, WL, AP ONLY): I-stat hCG, quantitative: <5 m[IU]/mL (ref <5)

## 2019-10-17 LAB — NOVEL CORONAVIRUS, NAA (HOSP ORDER, SEND-OUT TO REF LAB; TAT 18-24 HRS): SARS-CoV-2, NAA: DETECTED — AB

## 2019-10-17 MED ORDER — DEXAMETHASONE SODIUM PHOSPHATE 10 MG/ML IJ SOLN
6.0000 mg | Freq: Once | INTRAMUSCULAR | Status: AC
  Administered 2019-10-17: 22:00:00 6 mg via INTRAVENOUS
  Filled 2019-10-17: qty 1

## 2019-10-17 MED ORDER — SODIUM CHLORIDE 0.9 % IV BOLUS
1000.0000 mL | Freq: Once | INTRAVENOUS | Status: AC
  Administered 2019-10-17: 1000 mL via INTRAVENOUS

## 2019-10-17 MED ORDER — PREDNISONE 20 MG PO TABS: 60.0000 mg | ORAL_TABLET | Freq: Every day | ORAL | 0 refills | Status: DC

## 2019-10-17 NOTE — ED Triage Notes (Signed)
Pt reports here 2 days ago and dx with Covid. Reports has asthma and is very SOB and inhaler not helping. Also having headache that is not relieved with tylenol.

## 2019-10-17 NOTE — ED Notes (Signed)
Pt ambulatory from triage 

## 2019-10-17 NOTE — Discharge Instructions (Addendum)
You were evaluated in the emergency department for increased shortness of breath.  Your chest x-ray did not show any signs of pneumonia and your blood work was unremarkable.  Your oxygen saturations held stable with exertion.  We are putting you on 4 more days of steroids.  You can use Tylenol as needed for fever and aches and pains.  Follow-up with your doctor or return if any worsening symptoms.

## 2019-10-17 NOTE — ED Notes (Signed)
PA at bedside.

## 2019-10-17 NOTE — ED Provider Notes (Signed)
[facility]-EMERGENCY DEPT Provider Note   CSN: [id number] Arrival date & time: 10/17/19  1819     History Chief Complaint  Patient presents with  . Covid positive  . Shortness of Breath  . Asthma  . Headache    Jasmine Daugherty is a 24 y.o. female with a past medical history of asthma, obesity, who presents today for evaluation of shortness of breath. She is on day 4 of coronavirus symptoms and received her positive test today.  She states that she is asthma and has been using her inhaler however it is not helping and she feels short of breath.  She reports she has a headache that is not relieved with Tylenol.  She reports she has had multiple episodes of diarrhea today.  No nausea. She reports significant fatigue, especially when she attempts to walk.  HPI     Past Medical History:  Diagnosis Date  . Asthma     There are no problems to display for this patient.   History reviewed. No pertinent surgical history.   OB History   No obstetric history on file.     No family history on file.  Social History   Tobacco Use  . Smoking status: Never Smoker  . Smokeless tobacco: Never Used  Substance Use Topics  . Alcohol use: No  . Drug use: No    Home Medications Prior to Admission medications   Medication Sig Start Date End Date Taking? Authorizing Provider  benzonatate (TESSALON) 100 MG capsule Take 1 capsule (100 mg total) by mouth every 8 (eight) hours. 10/15/19   Hyman Hopes, Margaux, PA-C  ibuprofen (ADVIL,MOTRIN) 600 MG tablet Take 1 tablet (600 mg total) by mouth every 6 (six) hours as needed. 09/14/18   Fayrene Helper, PA-C  oseltamivir (TAMIFLU) 75 MG capsule Take 1 capsule (75 mg total) by mouth every 12 (twelve) hours. 09/14/18   Fayrene Helper, PA-C  OVER THE COUNTER MEDICATION Take 1 tablet by mouth at bedtime. Over the counter Thyroid supplement    [provider]  predniSONE (DELTASONE) 10 MG tablet Take 4 tablets (40 mg total) by  mouth daily. 03/28/18   Vanetta Mulders, MD  SUMAtriptan (IMITREX) 100 MG tablet Take 1 tablet (100 mg total) by mouth every 2 (two) hours as needed for migraine. May repeat in 2 hours if headache persists or recurs. Patient not taking: Reported on 03/28/2018 11/17/17   Elson Areas, PA-C    Allergies    Tree extract  Review of Systems   Review of Systems  Constitutional: Positive for chills and fatigue.  Eyes: Negative for visual disturbance.  Respiratory: Positive for cough, chest tightness and shortness of breath.   Cardiovascular: Negative for palpitations and leg swelling.  Gastrointestinal: Positive for diarrhea. Negative for abdominal pain, nausea and vomiting.  Genitourinary: Negative for dysuria.  Musculoskeletal: Negative for back pain and neck pain.  Skin: Negative for color change, rash and wound.  Neurological: Positive for light-headedness and headaches. Negative for weakness.  Psychiatric/Behavioral: Negative for confusion.  All other systems reviewed and are negative.   Physical Exam Updated Vital Signs BP (!) 147/95   Pulse 95   Temp 98.3 F (36.8 C) (Oral)   Resp (!) 22   LMP 10/17/2019   SpO2 100%   Physical Exam Vitals and nursing note reviewed.  Constitutional:      General: She is not in acute distress.    Appearance: She is well-developed.  HENT:     Head:  Normocephalic and atraumatic.  Eyes:     Conjunctiva/sclera: Conjunctivae normal.     Pupils: Pupils are equal, round, and reactive to light.  Cardiovascular:     Rate and Rhythm: Normal rate and regular rhythm.     Heart sounds: No murmur.  Pulmonary:     Effort: Pulmonary effort is normal. No respiratory distress.     Breath sounds: Normal breath sounds. No decreased breath sounds, wheezing or rhonchi.  Abdominal:     Palpations: Abdomen is soft.     Tenderness: There is no abdominal tenderness.  Musculoskeletal:     Cervical back: Normal range of motion and neck supple.  Skin:     General: Skin is warm and dry.  Neurological:     General: No focal deficit present.     Mental Status: She is alert.  Psychiatric:        Mood and Affect: Mood normal.        Behavior: Behavior normal.     ED Results / Procedures / Treatments   Labs (all labs ordered are listed, but only abnormal results are displayed) Labs Reviewed - No data to display  EKG None  Radiology DG Chest [facility] 1 View  Result Date: 10/15/2019 CLINICAL DATA:  24 year old female with cough. EXAM: PORTABLE CHEST 1 VIEW COMPARISON:  Chest radiograph dated 09/13/2018. FINDINGS: The heart size and mediastinal contours are within normal limits. Both lungs are clear. The visualized skeletal structures are unremarkable. IMPRESSION: No active disease. Electronically Signed   By: Elgie Collard M.D.   On: 10/15/2019 22:37    Procedures Procedures (including critical care time)  Medications Ordered in ED Medications - No data to display  ED Course  I have reviewed the triage vital signs and the nursing notes.  Pertinent labs & imaging results that were available during my care of the patient were reviewed by me and considered in my medical decision making (see chart for details).    MDM Rules/Calculators/A&P                     Patient presents today for evaluation of multiple symptoms related to coronavirus.  She reports significant cough and shortness of breath that is unrelieved by her albuterol. In room she is 100% on room air.  When I attempted to get her to ambulate in the room she felt too lightheaded which she reports is related to the diarrhea. We will obtain labs, give fluid bolus.  RN was asked to obtain orthostatics and ambulate patient in room after fluid bolus.  At shift change care was transferred to Dr. Charm Barges who will follow pending studies, re-evaulate and determine disposition.    Jasmine Daugherty was evaluated in Emergency Department on 10/17/2019 for the symptoms described in the history  of present illness. She was evaluated in the context of the global COVID-19 pandemic, which necessitated consideration that the patient might be at risk for infection with the SARS-CoV-2 virus that causes COVID-19. Institutional protocols and algorithms that pertain to the evaluation of patients at risk for COVID-19 are in a state of rapid change based on information released by regulatory bodies including the CDC and federal and state organizations. These policies and algorithms were followed during the patient's care in the ED.  Final Clinical Impression(s) / ED Diagnoses Final diagnoses:  COVID-19    Rx / DC Orders ED Discharge Orders    None       Cristina Gong, PA-C 10/17/19 2150  Hayden Rasmussen, MD 10/18/19 1040

## 2019-10-17 NOTE — ED Notes (Addendum)
During the standing portion of orthostatic vital signs, the pt stated she felt light headed. Pt appeared to be swaying back in forth, but pt stated she was able to continue standing to finish assessing her BP. Additionally, the pt ambulated in the room. O2 stayed in the upper 90s, lowest was 96% on RA. During ambulation, pt stated she felt shob. RN notified.

## 2019-10-18 ENCOUNTER — Other Ambulatory Visit: Payer: Self-pay | Admitting: Adult Health

## 2019-10-18 DIAGNOSIS — U071 COVID-19: Secondary | ICD-10-CM

## 2019-10-18 NOTE — Progress Notes (Signed)
  I connected by phone with Jasmine Daugherty on 10/18/2019 at 2:33 PM to discuss the potential use of an new treatment for mild to moderate COVID-19 viral infection in non-hospitalized patients.  This patient is a 24 y.o. female that meets the FDA criteria for Emergency Use Authorization of bamlanivimab or casirivimab\imdevimab.  Has a (+) direct SARS-CoV-2 viral test result  Has mild or moderate COVID-19 (Diarrhea , aches)   Is ? 24 years of age and weighs ? 40 kg  Is NOT hospitalized due to COVID-19  Is NOT requiring oxygen therapy or requiring an increase in baseline oxygen flow rate due to COVID-19  Is within 10 days of symptom onset (sx onset 1/12)  Has at least one of the high risk factor(s) for progression to severe COVID-19 and/or hospitalization as defined in EUA.  Specific high risk criteria : BMI >35    I have spoken and communicated the following to the patient or parent/caregiver:  1. FDA has authorized the emergency use of bamlanivimab and casirivimab\imdevimab for the treatment of mild to moderate COVID-19 in adults and pediatric patients with positive results of direct SARS-CoV-2 viral testing who are 76 years of age and older weighing at least 40 kg, and who are at high risk for progressing to severe COVID-19 and/or hospitalization.  2. The significant known and potential risks and benefits of bamlanivimab and casirivimab\imdevimab, and the extent to which such potential risks and benefits are unknown.  3. Information on available alternative treatments and the risks and benefits of those alternatives, including clinical trials.  4. Patients treated with bamlanivimab and casirivimab\imdevimab should continue to self-isolate and use infection control measures (e.g., wear mask, isolate, social distance, avoid sharing personal items, clean and disinfect "high touch" surfaces, and frequent handwashing) according to CDC guidelines.   5. The patient or parent/caregiver has the  option to accept or refuse bamlanivimab or casirivimab\imdevimab .  After reviewing this information with the patient, Agrees  To Bamlanivimab   [name] 10/18/2019 2:33 PM

## 2019-10-21 ENCOUNTER — Ambulatory Visit (HOSPITAL_COMMUNITY)
Admission: RE | Admit: 2019-10-21 | Discharge: 2019-10-21 | Disposition: A | Discharge status: Home / Self Care | Payer: BC Managed Care – PPO | Source: Ambulatory Visit | Attending: Pulmonary Disease | Admitting: Pulmonary Disease

## 2019-10-21 DIAGNOSIS — U071 COVID-19: Secondary | ICD-10-CM | POA: Diagnosis present

## 2019-10-21 DIAGNOSIS — Z23 Encounter for immunization: Secondary | ICD-10-CM | POA: Diagnosis not present

## 2019-10-21 MED ORDER — SODIUM CHLORIDE 0.9 % IV SOLN
700.0000 mg | Freq: Once | INTRAVENOUS | Status: AC
  Administered 2019-10-21: 700 mg via INTRAVENOUS
  Filled 2019-10-21: qty 20

## 2019-10-21 MED ORDER — SODIUM CHLORIDE 0.9 % IV SOLN
INTRAVENOUS | Status: DC | PRN
  Administered 2019-10-21: 250 mL via INTRAVENOUS

## 2019-10-21 MED ORDER — METHYLPREDNISOLONE SODIUM SUCC 125 MG IJ SOLR: 125.0000 mg | Freq: Once | INTRAMUSCULAR | Status: DC | PRN

## 2019-10-21 MED ORDER — DIPHENHYDRAMINE HCL 50 MG/ML IJ SOLN: 50.0000 mg | Freq: Once | INTRAMUSCULAR | Status: DC | PRN

## 2019-10-21 MED ORDER — ALBUTEROL SULFATE HFA 108 (90 BASE) MCG/ACT IN AERS: 2.0000 | INHALATION_SPRAY | Freq: Once | RESPIRATORY_TRACT | Status: DC | PRN

## 2019-10-21 MED ORDER — EPINEPHRINE 0.3 MG/0.3ML IJ SOAJ: 0.3000 mg | Freq: Once | INTRAMUSCULAR | Status: DC | PRN

## 2019-10-21 MED ORDER — FAMOTIDINE IN NACL 20-0.9 MG/50ML-% IV SOLN: 20.0000 mg | Freq: Once | INTRAVENOUS | Status: DC | PRN

## 2019-10-21 NOTE — Progress Notes (Signed)
  Diagnosis: COVID-19  Physician: Dr. Wright  Procedure: [facility] Med: bamlanivimab infusion - Provided patient with bamlanimivab fact sheet for patients, parents and caregivers prior to infusion.  Complications: No immediate complications noted.  Discharge: Discharged home   Jasmine Daugherty [name] 10/21/2019   

## 2019-10-21 NOTE — Discharge Instructions (Signed)

## 2019-12-25 ENCOUNTER — Ambulatory Visit: Payer: Self-pay

## 2020-01-10 ENCOUNTER — Encounter (HOSPITAL_COMMUNITY): Payer: Self-pay | Admitting: *Deleted

## 2020-01-10 ENCOUNTER — Other Ambulatory Visit: Payer: Self-pay

## 2020-01-10 ENCOUNTER — Emergency Department (HOSPITAL_COMMUNITY): Payer: BC Managed Care – PPO

## 2020-01-10 ENCOUNTER — Emergency Department (HOSPITAL_COMMUNITY)
Admission: EM | Admit: 2020-01-10 | Discharge: 2020-01-10 | Disposition: A | Discharge status: Home / Self Care | Payer: BC Managed Care – PPO | Attending: Emergency Medicine | Admitting: Emergency Medicine

## 2020-01-10 DIAGNOSIS — Y999 Unspecified external cause status: Secondary | ICD-10-CM | POA: Diagnosis not present

## 2020-01-10 DIAGNOSIS — Y929 Unspecified place or not applicable: Secondary | ICD-10-CM | POA: Diagnosis not present

## 2020-01-10 DIAGNOSIS — S99912A Unspecified injury of left ankle, initial encounter: Secondary | ICD-10-CM | POA: Diagnosis present

## 2020-01-10 DIAGNOSIS — M79662 Pain in left lower leg: Secondary | ICD-10-CM | POA: Diagnosis not present

## 2020-01-10 DIAGNOSIS — S93402A Sprain of unspecified ligament of left ankle, initial encounter: Secondary | ICD-10-CM | POA: Insufficient documentation

## 2020-01-10 DIAGNOSIS — Y9389 Activity, other specified: Secondary | ICD-10-CM | POA: Diagnosis not present

## 2020-01-10 DIAGNOSIS — W19XXXA Unspecified fall, initial encounter: Secondary | ICD-10-CM

## 2020-01-10 DIAGNOSIS — W108XXA Fall (on) (from) other stairs and steps, initial encounter: Secondary | ICD-10-CM | POA: Diagnosis not present

## 2020-01-10 MED ORDER — ACETAMINOPHEN 500 MG PO TABS
1000.0000 mg | ORAL_TABLET | Freq: Once | ORAL | Status: AC
  Administered 2020-01-10: 1000 mg via ORAL
  Filled 2020-01-10: qty 2

## 2020-01-10 NOTE — ED Notes (Signed)
Pt verbalizes understanding of DC instructions. Pt belongings returned and is ambulatory with crutches out of ED.  ° °

## 2020-01-10 NOTE — ED Provider Notes (Signed)
Jasmine Daugherty   CSN: [id number] Arrival date & time: 01/10/20  4944     History Chief Complaint  Patient presents with  . Ankle Injury    Jasmine Daugherty is a 24 y.o. female.  HPI   Pt is a 24 y/o female with a h/o asthma who presents to the ED today for eval of after a fall. States she was running in the rain and tried to walk up some stairs.  When she did this her foot slipped and she scraped her shin on the stairs and twisted her ankle.  She has pain to the left ankle and left distal leg.  She denies any foot pain.  She does feel like she has some sensory changes.  She states she fell back and hit her head but did not lose consciousness.  She does not have a headache.  She is had no episodes of vomiting.  She has no vision changes, numbness or weakness.  She denies any neck or back pain.  Past Medical History:  Diagnosis Date  . Asthma     There are no problems to display for this patient.   History reviewed. No pertinent surgical history.   OB History   No obstetric history on file.     No family history on file.  Social History   Tobacco Use  . Smoking status: Never Smoker  . Smokeless tobacco: Never Used  Substance Use Topics  . Alcohol use: No  . Drug use: No    Home Medications Prior to Admission medications   Medication Sig Start Date End Date Taking? Authorizing Provider  acetaminophen (TYLENOL) 500 MG tablet Take 1,000 mg by mouth every 6 (six) hours as needed for mild pain, moderate pain or fever.    [provider]  albuterol (VENTOLIN HFA) 108 (90 Base) MCG/ACT inhaler Inhale 2 puffs into the lungs every 4 (four) hours as needed for wheezing or shortness of breath.    [provider]  benzonatate (TESSALON) 100 MG capsule Take 1 capsule (100 mg total) by mouth every 8 (eight) hours. 10/15/19   Hyman Hopes, Margaux, PA-C  ibuprofen (ADVIL) 800 MG tablet Take 800 mg by mouth every 8 (eight)  hours as needed for fever, mild pain or moderate pain.    [provider]  ibuprofen (ADVIL,MOTRIN) 600 MG tablet Take 1 tablet (600 mg total) by mouth every 6 (six) hours as needed. Patient not taking: Reported on 10/17/2019 09/14/18   Fayrene Helper, PA-C  oseltamivir (TAMIFLU) 75 MG capsule Take 1 capsule (75 mg total) by mouth every 12 (twelve) hours. Patient not taking: Reported on 10/17/2019 09/14/18   Fayrene Helper, PA-C  predniSONE (DELTASONE) 20 MG tablet Take 3 tablets (60 mg total) by mouth daily. 10/17/19   Terrilee Files, MD  SUMAtriptan (IMITREX) 100 MG tablet Take 1 tablet (100 mg total) by mouth every 2 (two) hours as needed for migraine. May repeat in 2 hours if headache persists or recurs. Patient not taking: Reported on 03/28/2018 11/17/17   Elson Areas, PA-C    Allergies    Tree extract  Review of Systems   Review of Systems  Musculoskeletal: Negative for back pain and neck pain.       Left ankle and leg pain  Skin: Positive for wound.  Neurological: Negative for weakness and numbness.       Head injury, no loc    Physical Exam Updated Vital Signs BP 128/87 (BP  Location: Left Arm)   Pulse 85   Temp 98.5 F (36.9 C) (Oral)   Resp 16   LMP 12/10/2019   SpO2 98%   Physical Exam Vitals and nursing Daugherty reviewed.  Constitutional:      General: She is not in acute distress.    Appearance: She is well-developed.  HENT:     Head: Normocephalic and atraumatic.  Eyes:     Conjunctiva/sclera: Conjunctivae normal.  Cardiovascular:     Rate and Rhythm: Normal rate.  Pulmonary:     Effort: Pulmonary effort is normal.  Musculoskeletal:     Cervical back: Neck supple.     Comments: TTP to the distal tib/fib and to the medial/lateral malleolus. No TTP throughout the foot. DP pulse intact to the foot. Able to wiggle toes and dorsiflex/plantarflex foot.   Skin:    General: Skin is warm and dry.  Neurological:     Mental Status: She is alert.     Comments:  Mental Status:  Alert, thought content appropriate, able to give a coherent history. Speech fluent without evidence of aphasia. Able to follow 2 step commands without difficulty.  Cranial Nerves:  II: pupils equal, round, reactive to light III,IV, VI: ptosis not present, extra-ocular motions intact bilaterally  V,VII: smile symmetric, facial light touch sensation equal VIII: hearing grossly normal to voice  X: uvula elevates symmetrically  XI: bilateral shoulder shrug symmetric and strong XII: midline tongue extension without fassiculations Motor:  Normal tone. 5/5 strength of BUE and BLE major muscle groups including strong and equal grip strength with exception of slightly decreased strength to the left ankle 2/2 pain Sensory: light touch grossly normal in all extremities. Gait: not assessed due to pain      ED Results / Procedures / Treatments   Labs (all labs ordered are listed, but only abnormal results are displayed) Labs Reviewed - No data to display  EKG None  Radiology DG Tibia/Fibula Left  Result Date: 01/10/2020 CLINICAL DATA:  Fall EXAM: LEFT TIBIA AND FIBULA - 2 VIEW COMPARISON:  None. FINDINGS: There is no evidence of fracture or other focal bone lesions. Soft tissues are unremarkable. IMPRESSION: Negative. Electronically Signed   By: Donavan Foil M.D.   On: 01/10/2020 19:39   DG Ankle Complete Left  Result Date: 01/10/2020 CLINICAL DATA:  Fall EXAM: LEFT ANKLE COMPLETE - 3+ VIEW COMPARISON:  None. FINDINGS: There is no evidence of fracture, dislocation, or joint effusion. There is no evidence of arthropathy or other focal bone abnormality. Soft tissues are unremarkable. IMPRESSION: Negative. Electronically Signed   By: Donavan Foil M.D.   On: 01/10/2020 19:40    Procedures Procedures (including critical care time) SPLINT APPLICATION Date/Time: 0:34 PM Authorized by: Rodney Booze Consent: Verbal consent obtained. Risks and benefits: risks, benefits and  alternatives were discussed Consent given by: patient Splint applied by: orthopedic technician Location details: LLE Splint type: ASO splint Post-procedure: The splinted body part was neurovascularly unchanged following the procedure. Patient tolerance: Patient tolerated the procedure well with no immediate complications.   Medications Ordered in ED Medications  acetaminophen (TYLENOL) tablet 1,000 mg (1,000 mg Oral Given 01/10/20 1910)    ED Course  I have reviewed the triage vital signs and the nursing notes.  Pertinent labs & imaging results that were available during my care of the patient were reviewed by me and considered in my medical decision making (see chart for details).    MDM Rules/Calculators/A&P  Patient presenting with ankle pain after twisting ankle prior to arrival.  Vital signs stable and patient nontoxic-appearing.  X-ray of left ankle and tib/fib personally reviewed and negative for acute fracture abnormality.  A splint was applied and crutches given. She did also report head trauma but did not lose consciousness or have any red flag signs/sxs. Her neuro exam is normal. Per [place] head CT rules, pt does not require CT scan. We did discussed obtaining a CT however pt states she is completely asymptomatic and does not want to undergo imaging at this time. She is comfortable monitoring her sxs at home and returning if she has red flag sxs. Advised on pcp f/u for her ankle sprain.  Advised Tylenol, ibuprofen, and rice protocol for pain.  Advised to return to the ER for any new or worsening symptoms in the meantime.  All questions were answered and patient understands plan and reasons to return.  Final Clinical Impression(s) / ED Diagnoses Final diagnoses:  Fall, initial encounter  Sprain of left ankle, unspecified ligament, initial encounter    Rx / DC Orders ED Discharge Orders    None       Rayne Du 01/10/20 2007      Gerhard Munch, MD 01/10/20 2221

## 2020-01-10 NOTE — Discharge Instructions (Signed)

## 2020-01-10 NOTE — ED Triage Notes (Signed)
Per EMS, pt from home, tripped on concrete, injuring left foot. She hit her head. No LOC, no head, neck or back pain. Good pedal pulse, abrasion on left lower leg.  BP 120/90 HR 74 O2 100%

## 2020-03-10 ENCOUNTER — Other Ambulatory Visit: Payer: Self-pay | Admitting: Physician Assistant

## 2020-03-10 ENCOUNTER — Ambulatory Visit
Admission: RE | Admit: 2020-03-10 | Discharge: 2020-03-10 | Disposition: A | Discharge status: Home / Self Care | Payer: BC Managed Care – PPO | Source: Ambulatory Visit | Attending: Physician Assistant | Admitting: Physician Assistant

## 2020-03-10 DIAGNOSIS — E010 Iodine-deficiency related diffuse (endemic) goiter: Secondary | ICD-10-CM

## 2020-05-21 DIAGNOSIS — R87619 Unspecified abnormal cytological findings in specimens from cervix uteri: Secondary | ICD-10-CM | POA: Insufficient documentation

## 2020-05-26 ENCOUNTER — Other Ambulatory Visit: Payer: Self-pay

## 2020-05-26 DIAGNOSIS — Z20822 Contact with and (suspected) exposure to covid-19: Secondary | ICD-10-CM

## 2020-05-27 LAB — NOVEL CORONAVIRUS, NAA: SARS-CoV-2, NAA: NOT DETECTED

## 2020-05-27 LAB — SARS-COV-2, NAA 2 DAY TAT

## 2020-08-02 ENCOUNTER — Encounter (HOSPITAL_COMMUNITY): Payer: Self-pay | Admitting: Emergency Medicine

## 2020-08-02 ENCOUNTER — Ambulatory Visit (HOSPITAL_COMMUNITY)
Admission: EM | Admit: 2020-08-02 | Discharge: 2020-08-02 | Disposition: A | Discharge status: Home / Self Care | Payer: BC Managed Care – PPO | Attending: Family Medicine | Admitting: Family Medicine

## 2020-08-02 ENCOUNTER — Other Ambulatory Visit: Payer: Self-pay

## 2020-08-02 DIAGNOSIS — M25462 Effusion, left knee: Secondary | ICD-10-CM

## 2020-08-02 DIAGNOSIS — S8002XA Contusion of left knee, initial encounter: Secondary | ICD-10-CM

## 2020-08-02 DIAGNOSIS — S8012XA Contusion of left lower leg, initial encounter: Secondary | ICD-10-CM

## 2020-08-02 MED ORDER — NAPROXEN SODIUM 550 MG PO TABS: 550.0000 mg | ORAL_TABLET | Freq: Two times a day (BID) | ORAL | 0 refills | Status: DC
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
## 2020-08-02 NOTE — Discharge Instructions (Addendum)
Limit walking for next couple of Take naproxen 2 times a day with food Do not take ibuprofen while on naproxen Use ice for 20 minutes every couple of hours See sports medicine doctors if you fail to improve
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
## 2020-08-03 ENCOUNTER — Ambulatory Visit (INDEPENDENT_AMBULATORY_CARE_PROVIDER_SITE_OTHER): Payer: Self-pay | Admitting: Family Medicine

## 2020-08-03 ENCOUNTER — Other Ambulatory Visit: Payer: Self-pay

## 2020-08-03 VITALS — BP 134/73 | Ht 67.5 in | Wt 290.0 lb

## 2020-08-03 DIAGNOSIS — M25562 Pain in left knee: Secondary | ICD-10-CM

## 2020-08-03 NOTE — Patient Instructions (Signed)
You have a patellar contusion. Though this is painful, it should heal well within a few weeks.  - Use the Naproxen as you were prescribed by the ER (Always with food in your stomach).  - Perform the Quad exercises you were given today at least 2-3x daily until your pain has resolved. - Return to clinic if you still have symptoms after one month.
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
## 2020-08-04 ENCOUNTER — Encounter: Payer: Self-pay | Admitting: Family Medicine

## 2020-09-08 ENCOUNTER — Ambulatory Visit: Payer: Self-pay | Admitting: Family Medicine

## 2020-09-13 ENCOUNTER — Other Ambulatory Visit: Payer: Self-pay

## 2020-09-13 ENCOUNTER — Encounter: Payer: Self-pay | Admitting: Family Medicine

## 2020-09-13 ENCOUNTER — Ambulatory Visit (INDEPENDENT_AMBULATORY_CARE_PROVIDER_SITE_OTHER): Payer: Self-pay | Admitting: Family Medicine

## 2020-09-13 VITALS — BP 122/70 | Ht 67.5 in | Wt 280.0 lb

## 2020-09-13 DIAGNOSIS — S8002XD Contusion of left knee, subsequent encounter: Secondary | ICD-10-CM

## 2020-09-13 DIAGNOSIS — S8002XA Contusion of left knee, initial encounter: Secondary | ICD-10-CM | POA: Insufficient documentation

## 2020-09-13 DIAGNOSIS — M25562 Pain in left knee: Secondary | ICD-10-CM

## 2020-09-13 NOTE — Progress Notes (Signed)
    SUBJECTIVE:   CHIEF COMPLAINT / HPI:   Knee pain:.  Since her last visit for this issue she states the pain has resolved but still has a "throbbing" sensation.  She has taken the medications and done the exercises we advised her to do last time.  She has decreased her workload at work so that she is putting less stress on her knee.  The anterior knee surrounding the patella is where it throbs.  She currently takes ibuprofen 800 mg at night every 2 to 3 days.  Activity makes the pain worse, resting makes it better. She has not been icing it but has been elevating it during sleep. At work her leg will give out on her occasionally.  She sits down when this happens.  She has not fallen due to this.  There is some mild swelling on certain days.  Usually this occurs with more activity.     PERTINENT  PMH / PSH: none  OBJECTIVE:   BP 122/70   Ht 5' 7.5" (1.715 m)   Wt 280 lb (127 kg)   BMI 43.21 kg/m   General: Alert, oriented.  No acute distress MSK: No bruising or swelling appreciated on visual inspection of the left knee.  No tenderness to palpation of the patella or knee joint.  Some knee pain with maximal passive hip/knee flexion but FROM.  Normal strength.  Normal valgus/varus laxity.  Negative patellar grind test.  Negative anterior/posterior drawer.  Negative Apley test.  ASSESSMENT/PLAN:   Contusion of left patella Pain resolved, but still has a "throbbing sensation."  Believe this is just residual healing from previous injury.  No x-rays have been performed yet so we will get those today although ultrasound on her last visit was not concerning. -NSAIDs and Tylenol as needed for pain -Ice as needed -Home exercises provided -Follow-up in 6 weeks if pain not improved     Sandre Kitty, MD [facility]

## 2020-09-13 NOTE — Patient Instructions (Signed)
Get x-rays of your knee after you leave today. Start home exercises and do them most days of the week as directed. Tylenol 500mg  1-2 tabs three times a day as needed. Aleve 2 tabs twice a day as needed for pain and inflammation. Assuming the x-rays come back normal follow up with me in 6 weeks.

## 2020-09-13 NOTE — Assessment & Plan Note (Signed)
Pain resolved, but still has a "throbbing sensation."  Back this is just residual healing from previous injury.  No x-rays have been performed yet so we will get those today although ultrasound on her last visit was not concerning. -NSAIDs and Tylenol as needed for pain -Ice as needed -Home exercises provided -Follow-up in 6 weeks if pain not improved

## 2020-09-16 ENCOUNTER — Other Ambulatory Visit: Payer: Self-pay

## 2020-09-16 ENCOUNTER — Ambulatory Visit
Admission: RE | Admit: 2020-09-16 | Discharge: 2020-09-16 | Disposition: A | Discharge status: Home / Self Care | Payer: Medicaid Other | Source: Ambulatory Visit | Attending: Family Medicine | Admitting: Family Medicine

## 2020-09-16 DIAGNOSIS — M25562 Pain in left knee: Secondary | ICD-10-CM

## 2020-10-11 ENCOUNTER — Other Ambulatory Visit: Payer: Medicaid Other

## 2020-10-17 ENCOUNTER — Other Ambulatory Visit: Payer: Self-pay

## 2020-10-17 ENCOUNTER — Emergency Department (HOSPITAL_COMMUNITY)
Admission: EM | Admit: 2020-10-17 | Discharge: 2020-10-17 | Disposition: A | Discharge status: Home / Self Care | Payer: Self-pay | Attending: Emergency Medicine | Admitting: Emergency Medicine

## 2020-10-17 ENCOUNTER — Emergency Department (HOSPITAL_COMMUNITY): Payer: Self-pay

## 2020-10-17 DIAGNOSIS — M79645 Pain in left finger(s): Secondary | ICD-10-CM | POA: Insufficient documentation

## 2020-10-17 DIAGNOSIS — J45909 Unspecified asthma, uncomplicated: Secondary | ICD-10-CM | POA: Insufficient documentation

## 2020-10-17 MED ORDER — NAPROXEN 500 MG PO TABS: 500.0000 mg | ORAL_TABLET | Freq: Two times a day (BID) | ORAL | 0 refills | Status: DC

## 2020-10-17 MED ORDER — CEPHALEXIN 500 MG PO CAPS: 500.0000 mg | ORAL_CAPSULE | Freq: Three times a day (TID) | ORAL | 0 refills | Status: AC

## 2020-10-17 NOTE — ED Provider Notes (Signed)
[facility]-EMERGENCY DEPT Provider Note   CSN: [id number] Arrival date & time: 10/17/20  0840     History Chief Complaint  Patient presents with  . Finger Injury    Jasmine Daugherty is a 25 y.o. female presenting for evaluation of L pinky pain.   Pt states she has had pain and swelling of her L pinky finger x2 days. She is taking tylenol without improvement. Pain is constant, worse with movement. She is worried about infection, as she had an injury to the nail 1 month ago. During that time, she was wearing fake nails that pulled her nail off. She has no medical problems, takes no medications daily. She denies recent trauma or injury. She does not have a physically strenuous job.   HPI     Past Medical History:  Diagnosis Date  . Asthma     Patient Active Problem List   Diagnosis Date Noted  . Contusion of left patella 09/13/2020    No past surgical history on file.   OB History   No obstetric history on file.     No family history on file.  Social History   Tobacco Use  . Smoking status: Never Smoker  . Smokeless tobacco: Never Used  Vaping Use  . Vaping Use: Never used  Substance Use Topics  . Alcohol use: No  . Drug use: No    Home Medications Prior to Admission medications   Medication Sig Start Date End Date Taking? Authorizing Provider  cephALEXin (KEFLEX) 500 MG capsule Take 1 capsule (500 mg total) by mouth 3 (three) times daily for 5 days. 10/17/20 10/22/20 Yes Kemal Amores, PA-C  naproxen (NAPROSYN) 500 MG tablet Take 1 tablet (500 mg total) by mouth 2 (two) times daily with a meal. 10/17/20  Yes Felesia Stahlecker, PA-C  albuterol (VENTOLIN HFA) 108 (90 Base) MCG/ACT inhaler Inhale 2 puffs into the lungs every 4 (four) hours as needed for wheezing or shortness of breath.    [provider]  naproxen sodium (ANAPROX DS) 550 MG tablet Take 1 tablet (550 mg total) by mouth 2 (two) times daily with a meal. 08/02/20   Eustace Moore, MD  SUMAtriptan (IMITREX) 100 MG tablet Take 1 tablet (100 mg total) by mouth every 2 (two) hours as needed for migraine. May repeat in 2 hours if headache persists or recurs. Patient not taking: Reported on 03/28/2018 11/17/17   Elson Areas, PA-C    Allergies    Tree extract  Review of Systems   Review of Systems  Musculoskeletal: Positive for arthralgias and joint swelling.  Hematological: Does not bruise/bleed easily.  All other systems reviewed and are negative.   Physical Exam Updated Vital Signs BP 129/82 (BP Location: Left Arm)   Pulse 89   Temp 99 F (37.2 C) (Oral)   Resp 16   LMP 09/29/2020   SpO2 97%   Physical Exam Vitals and nursing note reviewed.  Constitutional:      General: She is not in acute distress.    Appearance: She is well-developed and well-nourished.  HENT:     Head: Normocephalic and atraumatic.  Eyes:     Extraocular Movements: EOM normal.  Pulmonary:     Effort: Pulmonary effort is normal.  Abdominal:     General: There is no distension.  Musculoskeletal:        General: Swelling and tenderness present. Normal range of motion.     Cervical back: Normal range of motion.  Comments: Mild swelling and tenderness of the proximal left pinky finger.  No erythema.  Limited range of motion due to pain and swelling.  Nail of the finger without tenderness, redness, or pain.  No purulent drainage.  No tenderness palpation of the distal finger.  No pain over the metacarpal.  Good distal sensation and cap refill.  Skin:    General: Skin is warm.     Capillary Refill: Capillary refill takes less than 2 seconds.     Findings: No rash.  Neurological:     Mental Status: She is alert and oriented to person, place, and time.  Psychiatric:        Mood and Affect: Mood and affect normal.     ED Results / Procedures / Treatments   Labs (all labs ordered are listed, but only abnormal results are displayed) Labs Reviewed - No data to  display  EKG None  Radiology DG Finger Little Left  Result Date: 10/17/2020 CLINICAL DATA:  Left fifth finger swelling. EXAM: LEFT LITTLE FINGER 2+V COMPARISON:  None. FINDINGS: There is no evidence of fracture or dislocation. There is no evidence of arthropathy or other focal bone abnormality. Soft tissues are unremarkable. IMPRESSION: Negative. Electronically Signed   By: Lupita Raider M.D.   On: 10/17/2020 10:11    Procedures Procedures (including critical care time)  Medications Ordered in ED Medications - No data to display  ED Course  I have reviewed the triage vital signs and the nursing notes.  Pertinent labs & imaging results that were available during my care of the patient were reviewed by me and considered in my medical decision making (see chart for details).    MDM Rules/Calculators/A&P                          Pt presenting for evaluation of L pinky finger pain. On exam, pt is neurovascularly intact.  Doubt infection is coming from previous nail injury, as there is no pain in this area.  Favor inflammation, pain in the joint and without trauma.  However as patient has pain and swelling, will order x-ray.  X-ray viewed interpreted by me, no fracture, dislocation, or other acute bony abnormality.  Discussed with patient.  Discussed I have low suspicion for infection, however as patient does not have an obvious traumatic reason for inflammation, will cover with antibiotics.  Will treat with NSAIDs.  Outpatient follow-up with Ortho if symptoms not proving.  At this time, patient appears safe for discharge. Return precautions given.  Patient states she understands and agrees to plan.    Final Clinical Impression(s) / ED Diagnoses Final diagnoses:  Finger pain, left    Rx / DC Orders ED Discharge Orders         Ordered    cephALEXin (KEFLEX) 500 MG capsule  3 times daily        10/17/20 1056    naproxen (NAPROSYN) 500 MG tablet  2 times daily with meals         10/17/20 1056           Yoshua Geisinger, PA-C 10/17/20 1111    Mancel Bale, MD 10/17/20 1501

## 2020-10-17 NOTE — ED Triage Notes (Signed)
Patient reports the nail bed of the left pinky finger came off and now she believes there is an infection and she is having trouble moving the finger.

## 2020-10-17 NOTE — Discharge Instructions (Addendum)
Take antibiotics as prescribed. Take the entire course, even if symptoms improve.  Take naproxen 2 times a day with meals.  Do not take other anti-inflammatories at the same time (Advil, Motrin, ibuprofen, Aleve). You may supplement with Tylenol if you need further pain control. Follow-up with orthopedic doctor listed below for symptoms and improvement. Return to the emergency room if you develop fevers, numbness of your finger, color change of the end of your finger, or any new, worsening, or concerning symptoms

## 2021-02-23 ENCOUNTER — Emergency Department (HOSPITAL_COMMUNITY)
Admission: EM | Admit: 2021-02-23 | Discharge: 2021-02-23 | Disposition: A | Discharge status: Home / Self Care | Payer: 59 | Attending: Emergency Medicine | Admitting: Emergency Medicine

## 2021-02-23 ENCOUNTER — Encounter (HOSPITAL_COMMUNITY): Payer: Self-pay | Admitting: *Deleted

## 2021-02-23 ENCOUNTER — Emergency Department (HOSPITAL_COMMUNITY): Payer: 59

## 2021-02-23 DIAGNOSIS — R1110 Vomiting, unspecified: Secondary | ICD-10-CM | POA: Diagnosis not present

## 2021-02-23 DIAGNOSIS — J45909 Unspecified asthma, uncomplicated: Secondary | ICD-10-CM | POA: Insufficient documentation

## 2021-02-23 DIAGNOSIS — R109 Unspecified abdominal pain: Secondary | ICD-10-CM | POA: Insufficient documentation

## 2021-02-23 DIAGNOSIS — R10A0 Flank pain, unspecified side: Secondary | ICD-10-CM

## 2021-02-23 DIAGNOSIS — M549 Dorsalgia, unspecified: Secondary | ICD-10-CM | POA: Diagnosis not present

## 2021-02-23 LAB — URINALYSIS, ROUTINE W REFLEX MICROSCOPIC
Bilirubin Urine: NEGATIVE
Glucose, UA: NEGATIVE mg/dL
Ketones, ur: NEGATIVE mg/dL
Leukocytes,Ua: NEGATIVE
Nitrite: NEGATIVE
Protein, ur: NEGATIVE mg/dL
Specific Gravity, Urine: 1.021 (ref 1.005–1.030)
pH: 5 (ref 5.0–8.0)

## 2021-02-23 LAB — CBC WITH DIFFERENTIAL/PLATELET
Abs Immature Granulocytes: 0.03 10*3/uL (ref 0.00–0.07)
Basophils Absolute: 0 10*3/uL (ref 0.0–0.1)
Basophils Relative: 0 %
Eosinophils Absolute: 0 10*3/uL (ref 0.0–0.5)
Eosinophils Relative: 1 %
HCT: 38.6 % (ref 36.0–46.0)
Hemoglobin: 12.9 g/dL (ref 12.0–15.0)
Immature Granulocytes: 0 %
Lymphocytes Relative: 15 %
Lymphs Abs: 1.1 10*3/uL (ref 0.7–4.0)
MCH: 31.4 pg (ref 26.0–34.0)
MCHC: 33.4 g/dL (ref 30.0–36.0)
MCV: 93.9 fL (ref 80.0–100.0)
Monocytes Absolute: 0.6 10*3/uL (ref 0.1–1.0)
Monocytes Relative: 8 %
Neutro Abs: 5.3 10*3/uL (ref 1.7–7.7)
Neutrophils Relative %: 76 %
Platelets: 298 10*3/uL (ref 150–400)
RBC: 4.11 MIL/uL (ref 3.87–5.11)
RDW: 12.7 % (ref 11.5–15.5)
WBC: 7.1 10*3/uL (ref 4.0–10.5)
nRBC: 0 % (ref 0.0–0.2)

## 2021-02-23 LAB — COMPREHENSIVE METABOLIC PANEL
ALT: 12 U/L (ref 0–44)
AST: 17 U/L (ref 15–41)
Albumin: 3.7 g/dL (ref 3.5–5.0)
Alkaline Phosphatase: 58 U/L (ref 38–126)
Anion gap: 7 (ref 5–15)
BUN: 12 mg/dL (ref 6–20)
CO2: 26 mmol/L (ref 22–32)
Calcium: 8.9 mg/dL (ref 8.9–10.3)
Chloride: 102 mmol/L (ref 98–111)
Creatinine, Ser: 0.84 mg/dL (ref 0.44–1.00)
GFR, Estimated: >60 mL/min (ref >60)
Glucose, Bld: 91 mg/dL (ref 70–99)
Potassium: 3.3 mmol/L — ABNORMAL LOW (ref 3.5–5.1)
Sodium: 135 mmol/L (ref 135–145)
Total Bilirubin: 0.9 mg/dL (ref 0.3–1.2)
Total Protein: 7.1 g/dL (ref 6.5–8.1)

## 2021-02-23 LAB — LIPASE, BLOOD: Lipase: 22 U/L (ref 11–51)

## 2021-02-23 LAB — I-STAT BETA HCG BLOOD, ED (MC, WL, AP ONLY): I-stat hCG, quantitative: <5 m[IU]/mL (ref <5)

## 2021-02-23 MED ORDER — ONDANSETRON HCL 4 MG/2ML IJ SOLN
4.0000 mg | Freq: Once | INTRAMUSCULAR | Status: AC
  Administered 2021-02-23: 4 mg via INTRAVENOUS
  Filled 2021-02-23: qty 2

## 2021-02-23 MED ORDER — SODIUM CHLORIDE 0.9 % IV SOLN: Freq: Once | INTRAVENOUS | Status: AC

## 2021-02-23 MED ORDER — ONDANSETRON 4 MG PO TBDP: 4.0000 mg | ORAL_TABLET | Freq: Three times a day (TID) | ORAL | 0 refills | Status: DC | PRN

## 2021-02-23 MED ORDER — MORPHINE SULFATE (PF) 4 MG/ML IV SOLN
4.0000 mg | Freq: Once | INTRAVENOUS | Status: AC
  Administered 2021-02-23: 4 mg via INTRAVENOUS
  Filled 2021-02-23: qty 1

## 2021-02-23 MED ORDER — METHOCARBAMOL 500 MG PO TABS
750.0000 mg | ORAL_TABLET | Freq: Once | ORAL | Status: AC
  Administered 2021-02-23: 750 mg via ORAL
  Filled 2021-02-23: qty 2

## 2021-02-23 MED ORDER — METHOCARBAMOL 500 MG PO TABS
500.0000 mg | ORAL_TABLET | Freq: Two times a day (BID) | ORAL | 0 refills | Status: DC
Start: 2021-02-23 — End: 2022-01-25

## 2021-02-23 NOTE — ED Provider Notes (Signed)
[facility]-EMERGENCY DEPT Provider Note   CSN: [id number] Arrival date & time: 02/23/21  1055     History Chief Complaint  Patient presents with  . Back Pain  . Emesis    Jasmine Daugherty is a 25 y.o. female.  HPI Patient is a morbidly obese 25 year old female with past medical history significant for asthma  Patient is presented today with concerns over back and abdominal aches that began yesterday evening.  She states that she vomited Monday and Tuesday several times. No vomiting today.  She states that yesterday after her last episode of emesis she has some achy pain that included her abdomen as well as her back.  She denies any chest pain or shortness of breath.  States that her abdomen is no longer hurting her at this moment.  States that it is primarily in her back.  Denies any history of kidney stones no hematuria frequency or urgency.  Denies any past surgical history.  Denies any vaginal discharge, urinary frequency urgency or hematuria.  Also denies any rectal pain.  Now pain is defecation no blood in stool melena hematochezia.    No fevers or chills.  No lightheadedness or dizziness.  No visit other significant associated symptoms.  No aggravating or palliating factors. The pain seems to wax and wane without any provocation.     Past Medical History:  Diagnosis Date  . Asthma     Patient Active Problem List   Diagnosis Date Noted  . Contusion of left patella 09/13/2020    History reviewed. No pertinent surgical history.   OB History   No obstetric history on file.     No family history on file.  Social History   Tobacco Use  . Smoking status: Never Smoker  . Smokeless tobacco: Never Used  Vaping Use  . Vaping Use: Never used  Substance Use Topics  . Alcohol use: No  . Drug use: No    Home Medications Prior to Admission medications   Medication Sig Start Date End Date Taking? Authorizing Provider  methocarbamol (ROBAXIN) 500  MG tablet Take 1 tablet (500 mg total) by mouth 2 (two) times daily. 02/23/21  Yes Solon Augusta S, PA    Allergies    Tree extract  Review of Systems   Review of Systems  Constitutional: Negative for chills and fever.  HENT: Negative for congestion.   Eyes: Negative for pain.  Respiratory: Negative for cough and shortness of breath.   Cardiovascular: Negative for chest pain and leg swelling.  Gastrointestinal: Positive for abdominal pain, nausea and vomiting. Negative for constipation and diarrhea.  Genitourinary: Negative for dysuria.  Musculoskeletal: Positive for back pain. Negative for myalgias.  Skin: Negative for rash.  Neurological: Negative for dizziness and headaches.    Physical Exam Updated Vital Signs BP 137/86   Pulse 79   Temp 100 F (37.8 C) (Oral)   Resp 18   Ht 5\' 7"  (1.702 m)   Wt 127 kg   LMP 02/16/2021   SpO2 99%   BMI 43.85 kg/m   Physical Exam Vitals and nursing note reviewed.  Constitutional:      General: She is in acute distress.     Comments: Uncomfortable appearing 25 year old female.  Speaking in full sentences.  Able answer questions appropriately and follow commands.  HENT:     Head: Normocephalic and atraumatic.     Nose: Nose normal.     Mouth/Throat:     Mouth: Mucous membranes are moist.  Eyes:     General: No scleral icterus. Cardiovascular:     Rate and Rhythm: Normal rate and regular rhythm.     Pulses: Normal pulses.     Heart sounds: Normal heart sounds.  Pulmonary:     Effort: Pulmonary effort is normal. No respiratory distress.     Breath sounds: No wheezing.  Abdominal:     Palpations: Abdomen is soft.     Tenderness: There is no abdominal tenderness. There is no right CVA tenderness, left CVA tenderness, guarding or rebound.  Musculoskeletal:     Cervical back: Normal range of motion.     Right lower leg: No edema.     Left lower leg: No edema.  Skin:    General: Skin is warm and dry.     Capillary Refill:  Capillary refill takes less than 2 seconds.  Neurological:     Mental Status: She is alert. Mental status is at baseline.  Psychiatric:        Mood and Affect: Mood normal.        Behavior: Behavior normal.     ED Results / Procedures / Treatments   Labs (all labs ordered are listed, but only abnormal results are displayed) Labs Reviewed  COMPREHENSIVE METABOLIC PANEL - Abnormal; Notable for the following components:      Result Value   Potassium 3.3 (*)    All other components within normal limits  URINALYSIS, ROUTINE W REFLEX MICROSCOPIC - Abnormal; Notable for the following components:   Hgb urine dipstick MODERATE (*)    Bacteria, UA RARE (*)    All other components within normal limits  CBC WITH DIFFERENTIAL/PLATELET  LIPASE, BLOOD  I-STAT BETA HCG BLOOD, ED ([facility], [facility], [facility] ONLY)    EKG None  Radiology CT Renal Stone Study  Result Date: 02/23/2021 CLINICAL DATA:  Left flank pain, vomiting and abdominal pain EXAM: CT ABDOMEN AND PELVIS WITHOUT CONTRAST TECHNIQUE: Multidetector CT imaging of the abdomen and pelvis was performed following the standard protocol without IV contrast. COMPARISON:  None. FINDINGS: Lower chest: Lung bases are clear. Normal heart size. No pericardial effusion. Hepatobiliary: No visible focal liver lesion within the limitations of this unenhanced CT. Smooth surface contour. Normal liver attenuation. Normal gallbladder and biliary tree without visible calcified gallstone, pericholecystic fluid or inflammation or biliary ductal dilatation. Pancreas: No pancreatic ductal dilatation or surrounding inflammatory changes. Spleen: Normal in size. No concerning splenic lesions. Adrenals/Urinary Tract: Normal adrenal glands. Kidneys are symmetric in size and normally located. No visible or contour deforming renal lesions. No urolithiasis or hydronephrosis. No conspicuous perinephric stranding or free fluid. Mild circumferential bladder wall thickening may be related to  underdistention. Stomach/Bowel: Distal esophagus, stomach and duodenal sweep are unremarkable. No small bowel wall thickening or dilatation. No evidence of obstruction. A normal appendix is visualized. No colonic dilatation or wall thickening. Vascular/Lymphatic: No significant vascular findings are present. Few clustered prominent though nonenlarged lymph nodes are seen in the mesentery of the left upper quadrant. No pathologically enlarged abdominopelvic lymph nodes are seen. Reproductive: Uterus and bilateral adnexa are unremarkable. Other: No abdominopelvic free fluid or free gas. No bowel containing hernias. Musculoskeletal: No acute osseous abnormality or suspicious osseous lesion. IMPRESSION: No obstructive urolithiasis or hydronephrosis. Mild bladder wall thickening, possibly related to underdistention though should consider urinalysis to exclude cystitis as clinically warranted. Clustered prominent though nonenlarged lymph nodes in the left upper quadrant mesentery. Could reflect early mesenteritis or reactive change such as from a mild enteritis albeit  with a grossly normal appearance of the bowel. Electronically Signed   By: Kreg Shropshire M.D.   On: 02/23/2021 14:54    Procedures Procedures   Medications Ordered in ED Medications  methocarbamol (ROBAXIN) tablet 750 mg (has no administration in time range)  0.9 %  sodium chloride infusion ( Intravenous Stopped 02/23/21 1541)  ondansetron (ZOFRAN) injection 4 mg (4 mg Intravenous Given 02/23/21 1223)  morphine 4 MG/ML injection 4 mg (4 mg Intravenous Given 02/23/21 1223)    ED Course  I have reviewed the triage vital signs and the nursing notes.  Pertinent labs & imaging results that were available during my care of the patient were reviewed by me and considered in my medical decision making (see chart for details).  Patient is 25 year old female presenting today with left-sided back pain, episodes of vomiting yesterday that have resolved.   She denies any fevers chills urinary frequency urgency or dysuria.  She describes the pain in her back as spasms.  She states she has no midline back pain.  On physical exam patient has no tenderness palpation of the C, T, L-spine.   Clinical Course as of 02/23/21 1548  Wed Feb 23, 2021  1323 Urinalysis with no evidence of infection with nitrate negative leukocyte negative however does have moderate hemoglobin patient states that she is no longer on her period some of this may be residual blood since she states earlier.  Ended yesterday however given her symptoms we will obtain CT renal stone study.   I-STAT Hg negative for pregnancy.  Lipase within normalized pancreatitis.  CMP unremarkable apart from marginal hypokalemia.  CBC without leukocytosis or anemia. [WF]  1502 CT scan without notable acute abnormality.  IMPRESSION: No obstructive urolithiasis or hydronephrosis.  Mild bladder wall thickening, possibly related to underdistention though should consider urinalysis to exclude cystitis as clinically warranted.  Clustered prominent though nonenlarged lymph nodes in the left upper quadrant mesentery. Could reflect early mesenteritis or reactive change such as from a mild enteritis albeit with a grossly normal appearance of the bowel. [WF]    Clinical Course User Index [WF] Gailen Shelter, PA   MDM Rules/Calculators/A&P                           On reassessment patient's abdomen is soft nontender.  Patient states that her pain is completely resolved.  Patient with urinalysis that is unremarkable apart from some hematuria likely from her recent menstrual cycle.  CT scan shows mild bladder wall thickening this is likely from underdistention.  She does have some clustered prominent nonenlarged lymph nodes which are nonspecific.  Parent made aware of work-up today.  Very well may could be from a viral illness.  Will discharge with Zofran and Robaxin.  Final Clinical  Impression(s) / ED Diagnoses Final diagnoses:  Flank pain    Rx / DC Orders ED Discharge Orders         Ordered    methocarbamol (ROBAXIN) 500 MG tablet  2 times daily        02/23/21 1511           Solon Augusta [place], [place] 02/23/21 1550    Gwyneth Sprout, MD 02/27/21 2051

## 2021-02-23 NOTE — Discharge Instructions (Signed)
Your work-up today including her lab work, CT scan, and urinalysis were unremarkable.  Please follow-up with your primary care provider.  Please drink plenty of water.  Please use muscle relaxer prescribed you.  Please use Tylenol or ibuprofen for pain.  You may use 600 mg ibuprofen every 6 hours or 1000 mg of Tylenol every 6 hours.  You may choose to alternate between the 2.  This would be most effective.  Not to exceed 4 g of Tylenol within 24 hours.  Not to exceed 3200 mg ibuprofen 24 hours.

## 2021-02-23 NOTE — ED Triage Notes (Signed)
Pt complains of mid-back pain since this morning. She reports vomiting and abdominal pain last night. She is concerned something is wrong with gallbladder.

## 2021-04-16 ENCOUNTER — Other Ambulatory Visit: Payer: Self-pay

## 2021-04-16 ENCOUNTER — Ambulatory Visit (HOSPITAL_COMMUNITY)
Admission: EM | Admit: 2021-04-16 | Discharge: 2021-04-16 | Disposition: A | Discharge status: Home / Self Care | Payer: 59

## 2021-04-16 DIAGNOSIS — F332 Major depressive disorder, recurrent severe without psychotic features: Secondary | ICD-10-CM

## 2021-04-16 NOTE — ED Provider Notes (Signed)
[facility] Medical Screening Exam  Patient Name: Jasmine Daugherty MRN: [id number] Date of Evaluation: 04/16/21 Chief Complaint:   Diagnosis:  Final diagnoses:  Severe episode of recurrent major depressive disorder, without psychotic features (HCC)    History of Present illness: Jasmine Daugherty is a 25 y.o. female.  Presents to [facility] urgent care accompanied with her friend.  She reports worsening depression with passive suicidal ideations.  Patient adamantly denies suicidal plan or intent.  States " I do not feel like myself, this is not likely me." Reported she got upset today and quit her job. Stated " they made me snap."    Stated she is overwhelmed, depressed and hasn't been resting well at night. Patient reported that she currently resides with her mother. denied history of substance abuse.  Denied previous inpatient admissions.  States she was followed by therapist in the past however did not keep follow-up appointments.  Denied that she is prescribed or taking any psychotropic medications currently.   Kiran was offered overnight observation however she declined.  Provided verbal authorization to follow-up with her friend.  Her friend denied any safety concerns.  States that "she is more than welcome to stay with me"  and denied that patient would have access to guns or weapons.  Discussed following up with partial hospitalization program.  Patient was receptive to plan.  Support,encouragement  and reassurance was provided.  Psychiatric Specialty Exam  Presentation  General Appearance:Appropriate for Environment  Eye Contact:Good  Speech:Clear and Coherent  Speech Volume:Normal  Handedness:Right   Mood and Affect  Mood:Anxious; Depressed  Affect:Congruent   Thought Process  Thought Processes:Coherent  Descriptions of Associations:Intact  Orientation:Full (Time, Place and Person)  Thought Content:Logical    Hallucinations:None  Ideas of  Reference:None  Suicidal Thoughts:Yes, Passive Without Intent; Without Plan  Homicidal Thoughts:No   Sensorium  Memory:Immediate Good; Recent Good; Remote Good  Judgment:Good  Insight:Fair   Executive Functions  Concentration:Fair  Attention Span:Fair  Recall:Good  Fund of Knowledge:Good  Language:Good   Psychomotor Activity  Psychomotor Activity:Normal   Assets  Assets:Communication Skills; Physical Health; Social Support   Sleep  Sleep:Fair  Number of hours:  No data recorded  Nutritional Assessment (For [facility] and [facility] admissions only) Has the patient had a weight loss or gain of 10 pounds or more in the last 3 months?: No Has the patient had a decrease in food intake/or appetite?: No Does the patient have dental problems?: No Does the patient have eating habits or behaviors that may be indicators of an eating disorder including binging or inducing vomiting?: No Has the patient recently lost weight without trying?: No Has the patient been eating poorly because of a decreased appetite?: No Malnutrition Screening Tool Score: 0   Physical Exam: Physical Exam Vitals and nursing note reviewed.  Cardiovascular:     Rate and Rhythm: Normal rate and regular rhythm.  Pulmonary:     Effort: Pulmonary effort is normal.     Breath sounds: Normal breath sounds.  Neurological:     Mental Status: She is alert.  Psychiatric:        Attention and Perception: Attention normal.        Mood and Affect: Mood normal.        Speech: Speech normal.        Behavior: Behavior normal.        Thought Content: Thought content normal.        Cognition and Memory: Cognition and memory normal.  Judgment: Judgment normal.   Review of Systems  Constitutional: Negative.   Respiratory: Negative.    Gastrointestinal: Negative.   Genitourinary: Negative.   Musculoskeletal: Negative.   Skin: Negative.   Endo/Heme/Allergies: Negative.   Psychiatric/Behavioral:  Positive  for depression. Negative for substance abuse. Suicidal ideas: passive ideaitons.The patient is nervous/anxious and has insomnia.   All other systems reviewed and are negative. Blood pressure (!) 149/85, pulse 86, temperature 98.6 F (37 C), temperature source Oral, resp. rate 16, SpO2 100 %. There is no height or weight on file to calculate BMI.  Musculoskeletal: Strength & Muscle Tone: within normal limits Gait & Station: normal Patient leans: N/A   [facility] MSE Discharge Disposition for Follow up and Recommendations: Based on my evaluation the patient does not appear to have an emergency medical condition and can be discharged with resources and follow up care in outpatient services for Medication Management and Partial Hospitalization Program     Oneta Rack, NP 04/16/2021, 6:35 PM

## 2021-04-16 NOTE — Progress Notes (Signed)
Patient presents to the [facility] as a walk-in.  Patient states that she does not feel okay mentally and states that she has a lot going on.  Patient states that she has let things build up to the point that she lashed out at work and quit her job today.  Patient states that she has been experiencing suicidal thoughts and states that she sometimes zones out when she is driving and thinks that she does not want to be around anymore, but states that she has no plan and would not do anything to harm herself.  She has no prior suicide attempts.  Patient states that she just bottles up everything and then gets overwhelmed.  Patient states that she has seen a therapist in the past, but states that it was not helpful to her to have therapy.  Patient denies HI/Psychosis and states that she only drinks alcohol on occasion.  Patient denies any drug use.  Patient states that she has not been sleeping well, but states that her appetite is good.  Patient states that she needs someone to help her figure out what she needs to do to help herself.  Patient is routine

## 2021-04-16 NOTE — ED Notes (Signed)
Pt discharged from facility and stated understanding of discharge criteria. Pt escorted to the front lobby. Pt alert, orient and ambulatory. Safety maintained.

## 2021-04-16 NOTE — Discharge Instructions (Addendum)
Take all medications as prescribed. Keep all follow-up appointments as scheduled.  Do not consume alcohol or use illegal drugs while on prescription medications. Report any adverse effects from your medications to your primary care provider promptly.  In the event of recurrent symptoms or worsening symptoms, call 911, a crisis hotline, or go to the nearest emergency department for evaluation.   

## 2021-05-07 ENCOUNTER — Ambulatory Visit (INDEPENDENT_AMBULATORY_CARE_PROVIDER_SITE_OTHER): Payer: 59 | Admitting: Psychiatry

## 2021-05-07 ENCOUNTER — Other Ambulatory Visit: Payer: Self-pay

## 2021-05-07 ENCOUNTER — Encounter (HOSPITAL_COMMUNITY): Payer: Self-pay | Admitting: Psychiatry

## 2021-05-07 DIAGNOSIS — F411 Generalized anxiety disorder: Secondary | ICD-10-CM | POA: Diagnosis not present

## 2021-05-07 DIAGNOSIS — F331 Major depressive disorder, recurrent, moderate: Secondary | ICD-10-CM

## 2021-05-07 MED ORDER — LAMOTRIGINE 25 MG PO TABS: 25.0000 mg | ORAL_TABLET | Freq: Every day | ORAL | 0 refills | Status: DC
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
## 2021-05-25 ENCOUNTER — Other Ambulatory Visit: Payer: Self-pay | Admitting: Physician Assistant

## 2021-05-25 DIAGNOSIS — R519 Headache, unspecified: Secondary | ICD-10-CM

## 2021-05-26 ENCOUNTER — Other Ambulatory Visit: Payer: 59

## 2021-06-04 ENCOUNTER — Other Ambulatory Visit: Payer: 59

## 2021-06-05 ENCOUNTER — Other Ambulatory Visit: Payer: 59

## 2021-06-12 ENCOUNTER — Encounter (HOSPITAL_BASED_OUTPATIENT_CLINIC_OR_DEPARTMENT_OTHER): Payer: Self-pay | Admitting: Emergency Medicine

## 2021-06-12 ENCOUNTER — Emergency Department (HOSPITAL_BASED_OUTPATIENT_CLINIC_OR_DEPARTMENT_OTHER): Payer: 59

## 2021-06-12 ENCOUNTER — Emergency Department (HOSPITAL_BASED_OUTPATIENT_CLINIC_OR_DEPARTMENT_OTHER)
Admission: EM | Admit: 2021-06-12 | Discharge: 2021-06-12 | Disposition: A | Discharge status: Home / Self Care | Payer: 59 | Source: Home / Self Care | Attending: Emergency Medicine | Admitting: Emergency Medicine

## 2021-06-12 ENCOUNTER — Encounter (HOSPITAL_COMMUNITY): Payer: Self-pay

## 2021-06-12 ENCOUNTER — Emergency Department (HOSPITAL_COMMUNITY)
Admission: EM | Admit: 2021-06-12 | Discharge: 2021-06-12 | Disposition: A | Discharge status: Home / Self Care | Payer: 59 | Attending: Emergency Medicine | Admitting: Emergency Medicine

## 2021-06-12 ENCOUNTER — Other Ambulatory Visit: Payer: Self-pay

## 2021-06-12 DIAGNOSIS — R519 Headache, unspecified: Secondary | ICD-10-CM | POA: Insufficient documentation

## 2021-06-12 DIAGNOSIS — J45909 Unspecified asthma, uncomplicated: Secondary | ICD-10-CM | POA: Insufficient documentation

## 2021-06-12 DIAGNOSIS — G43909 Migraine, unspecified, not intractable, without status migrainosus: Secondary | ICD-10-CM | POA: Insufficient documentation

## 2021-06-12 DIAGNOSIS — G43809 Other migraine, not intractable, without status migrainosus: Secondary | ICD-10-CM

## 2021-06-12 DIAGNOSIS — Z5321 Procedure and treatment not carried out due to patient leaving prior to being seen by health care provider: Secondary | ICD-10-CM | POA: Insufficient documentation

## 2021-06-12 LAB — BASIC METABOLIC PANEL
Anion gap: 8 (ref 5–15)
BUN: 10 mg/dL (ref 6–20)
CO2: 26 mmol/L (ref 22–32)
Calcium: 9.9 mg/dL (ref 8.9–10.3)
Chloride: 105 mmol/L (ref 98–111)
Creatinine, Ser: 0.94 mg/dL (ref 0.44–1.00)
GFR, Estimated: >60 mL/min (ref >60)
Glucose, Bld: 95 mg/dL (ref 70–99)
Potassium: 4.2 mmol/L (ref 3.5–5.1)
Sodium: 139 mmol/L (ref 135–145)

## 2021-06-12 LAB — CBC WITH DIFFERENTIAL/PLATELET
Abs Immature Granulocytes: 0.01 10*3/uL (ref 0.00–0.07)
Basophils Absolute: 0 10*3/uL (ref 0.0–0.1)
Basophils Relative: 0 %
Eosinophils Absolute: 0.1 10*3/uL (ref 0.0–0.5)
Eosinophils Relative: 1 %
HCT: 40.4 % (ref 36.0–46.0)
Hemoglobin: 13.6 g/dL (ref 12.0–15.0)
Immature Granulocytes: 0 %
Lymphocytes Relative: 36 %
Lymphs Abs: 2.9 10*3/uL (ref 0.7–4.0)
MCH: 31.2 pg (ref 26.0–34.0)
MCHC: 33.7 g/dL (ref 30.0–36.0)
MCV: 92.7 fL (ref 80.0–100.0)
Monocytes Absolute: 0.6 10*3/uL (ref 0.1–1.0)
Monocytes Relative: 7 %
Neutro Abs: 4.4 10*3/uL (ref 1.7–7.7)
Neutrophils Relative %: 56 %
Platelets: 362 10*3/uL (ref 150–400)
RBC: 4.36 MIL/uL (ref 3.87–5.11)
RDW: 12.3 % (ref 11.5–15.5)
WBC: 8 10*3/uL (ref 4.0–10.5)
nRBC: 0 % (ref 0.0–0.2)

## 2021-06-12 LAB — PREGNANCY, URINE: Preg Test, Ur: NEGATIVE

## 2021-06-12 MED ORDER — KETOROLAC TROMETHAMINE 30 MG/ML IJ SOLN
30.0000 mg | Freq: Once | INTRAMUSCULAR | Status: AC
  Administered 2021-06-12: 30 mg via INTRAVENOUS
  Filled 2021-06-12: qty 1

## 2021-06-12 MED ORDER — METOCLOPRAMIDE HCL 5 MG/ML IJ SOLN
10.0000 mg | Freq: Once | INTRAMUSCULAR | Status: AC
  Administered 2021-06-12: 10 mg via INTRAVENOUS
  Filled 2021-06-12: qty 2

## 2021-06-12 MED ORDER — SODIUM CHLORIDE 0.9 % IV BOLUS
1000.0000 mL | Freq: Once | INTRAVENOUS | Status: AC
  Administered 2021-06-12: 1000 mL via INTRAVENOUS

## 2021-06-12 MED ORDER — SODIUM CHLORIDE 0.9 % IV BOLUS
500.0000 mL | Freq: Once | INTRAVENOUS | Status: AC
  Administered 2021-06-12: 500 mL via INTRAVENOUS

## 2021-06-12 MED ORDER — DIPHENHYDRAMINE HCL 50 MG/ML IJ SOLN
50.0000 mg | Freq: Once | INTRAMUSCULAR | Status: AC
  Administered 2021-06-12: 50 mg via INTRAVENOUS
  Filled 2021-06-12: qty 1

## 2021-06-12 MED ORDER — MAGNESIUM SULFATE IN D5W 1-5 GM/100ML-% IV SOLN
1.0000 g | Freq: Once | INTRAVENOUS | Status: AC
  Administered 2021-06-12: 1 g via INTRAVENOUS
  Filled 2021-06-12: qty 100

## 2021-06-12 NOTE — ED Notes (Signed)
This RN called from registration at another facility requesting patient be taken out of system as she has arrived at alternate facility for treatment.

## 2021-06-12 NOTE — ED Notes (Signed)
Pt called on for reassess vitals signs. No answer.

## 2021-06-12 NOTE — ED Triage Notes (Addendum)
Pt presents to ED POV. Pt c/o L side HA x3d. Pt denies n/v and/or photophobia. Pt reports she was supposed to have out pt CT recently per PCP but missed.

## 2021-06-12 NOTE — ED Provider Notes (Signed)
[facility] EMERGENCY DEPT Provider Note   CSN: [id number] Arrival date & time: 06/12/21  1854     History Chief Complaint  Patient presents with   Migraine    Jasmine Daugherty is a 25 y.o. female who presents to the ED today with complaint of gradual onset, constant, worsening, diffuse, sharp, headache for the past 3 days.  Patient reports that for the past several months she has had on and off stabbing/sharp headaches.  She states she saw her PCP who wanted to get an MRI done for further evaluation.  She states that she missed her MRI appointment the other day.  She states that for the past couple of days she has had worsening more persistent headache.  She also complains of photophobia.  She attempted to go to [facility] emergency department tonight however left prior to being seen due to prolonged wait times and decided to come here for further evaluation.  She states she is taken multiple medications over-the-counter without relief.  He does report that she was diagnosed with migraines as a child however felt like she grew out of it.  She states while driving here she felt like she had blurry vision at the pull over.  It is since resolved.  She denies double vision.  She denies any nausea or vomiting.  She denies any speech changes, confusion, unilateral weakness or numbness.  No recent head trauma.   The history is provided by the patient and medical records.      Past Medical History:  Diagnosis Date   Asthma     Patient Active Problem List   Diagnosis Date Noted   Contusion of left patella 09/13/2020   Migraine 11/20/2017    History reviewed. No pertinent surgical history.   OB History   No obstetric history on file.     History reviewed. No pertinent family history.  Social History   Tobacco Use   Smoking status: Never   Smokeless tobacco: Never  Vaping Use   Vaping Use: Never used  Substance Use Topics   Alcohol use: No   Drug use: No    Home  Medications Prior to Admission medications   Medication Sig Start Date End Date Taking? Authorizing Provider  FLUoxetine (PROZAC) 20 MG capsule Take 20 mg by mouth daily. 04/26/21   [provider]  lamoTRIgine (LAMICTAL) 25 MG tablet Take 1 tablet (25 mg total) by mouth daily. Take one tablet daily for a week and then start taking 2 tablets. 05/07/21   Thresa Ross, MD  methocarbamol (ROBAXIN) 500 MG tablet Take 1 tablet (500 mg total) by mouth 2 (two) times daily. 02/23/21   Gailen Shelter, PA  ondansetron (ZOFRAN ODT) 4 MG disintegrating tablet Take 1 tablet (4 mg total) by mouth every 8 (eight) hours as needed for nausea or vomiting. 02/23/21   Gailen Shelter, PA    Allergies    Tree extract  Review of Systems   Review of Systems  Constitutional:  Negative for chills and fever.  HENT:  Negative for sore throat.   Eyes:  Positive for photophobia.  Gastrointestinal:  Negative for nausea and vomiting.  Musculoskeletal:  Negative for myalgias.  Neurological:  Positive for headaches.  All other systems reviewed and are negative.  Physical Exam Updated Vital Signs BP (!) 143/101 (BP Location: Right Arm)   Pulse 79   Temp 99.2 F (37.3 C) (Oral)   Resp 18   Ht 5\' 7"  (1.702 m)  Wt 124.7 kg   LMP 05/25/2021 (Within Days)   SpO2 99%   BMI 43.07 kg/m   Physical Exam Vitals and nursing note reviewed.  Constitutional:      Appearance: She is not ill-appearing or diaphoretic.     Comments: Sitting in room with hoodie pulled over her head due to photophobia  HENT:     Head: Normocephalic and atraumatic.  Eyes:     Extraocular Movements: Extraocular movements intact.     Conjunctiva/sclera: Conjunctivae normal.     Pupils: Pupils are equal, round, and reactive to light.  Cardiovascular:     Rate and Rhythm: Normal rate and regular rhythm.  Pulmonary:     Effort: Pulmonary effort is normal.     Breath sounds: Normal breath sounds.  Abdominal:     Palpations: Abdomen  is soft.     Tenderness: There is no abdominal tenderness.  Musculoskeletal:     Cervical back: Neck supple.  Skin:    General: Skin is warm and dry.  Neurological:     Mental Status: She is alert.     Comments: Alert and oriented to self, place, time and event.   Speech is fluent, clear without dysarthria or dysphasia.   Strength 5/5 in upper/lower extremities   Sensation intact in upper/lower extremities   Normal gait.  Negative Romberg. No pronator drift.  Normal finger-to-nose and feet tapping.  CN I not tested  CN II grossly intact visual fields bilaterally. Did not visualize posterior eye.  CN III, IV, VI PERRLA and EOMs intact bilaterally  CN V Intact sensation to sharp and light touch to the face  CN VII facial movements symmetric  CN VIII not tested  CN IX, X no uvula deviation, symmetric rise of soft palate  CN XI 5/5 SCM and trapezius strength bilaterally  CN XII Midline tongue protrusion, symmetric L/R movements      ED Results / Procedures / Treatments   Labs (all labs ordered are listed, but only abnormal results are displayed) Labs Reviewed  BASIC METABOLIC PANEL  CBC WITH DIFFERENTIAL/PLATELET  PREGNANCY, URINE    EKG None  Radiology No results found.  Procedures Procedures   Medications Ordered in ED Medications  sodium chloride 0.9 % bolus 1,000 mL (has no administration in time range)    ED Course  I have reviewed the triage vital signs and the nursing notes.  Pertinent labs & imaging results that were available during my care of the patient were reviewed by me and considered in my medical decision making (see chart for details).    MDM Rules/Calculators/A&P                           25 year old female who presents to the ED today with persistent stabbing headache for the past 3 days.  Has had intermittent headaches for the past several months.  On arrival to the ED today patient's temperature is 99.2.  She is nontachycardic,  nontachypneic.  Blood pressure slightly elevated at 143/101 without history of same.  It does appear she went to [facility] long hospital however left prior to being seen due to prolonged wait times.  On my exam she has no acute focal neurodeficits.  She has no meningeal signs.  She is in the room sitting with her eyes covered with her hoodie due to photophobia.  Her symptoms do seem like classic migraine at this time.  We do not have MRI capability here.  It does appear that her PCP ordered MRI in the outpatient setting due to persistent intermittent headaches for the past several months.  Does not appear she had a CT scan.  Will obtain CT scan with new/worsening headache at this time and provide headache cocktail.  Patient is unsure if she could be pregnant at this time as she is sexually active.  We will plan for urine pregnancy test prior to Toradol.  She states that she did drive herself here however is currently attempting to find a ride prior to headache cocktail given it will likely make her sleepy.  Pt found ride. Will provide headache cocktail at this time. Labs and imaging still pending. Will hold off on toradol until CT imaging and preg test return  CBC without leukocytosis. Hgb stable at 13.6 BMP without electrolyte abnormalities. Creatinine stable at 0.94 UPT negative CT negative  Repeat BP improved 136/82. Suspect slightly elevated initially s/2 pain.   On reevaluation after headache cocktail and fluids pt reports her pain went from a 10/10 to a 8.5/10. Will provide mag and reevaluate today.   At shift change case signed out to attending physician Dr. Rush Landmark who will dispo accordingly.   This note was prepared using Dragon voice recognition software and may include unintentional dictation errors due to the inherent limitations of voice recognition software.   Final Clinical Impression(s) / ED Diagnoses Final diagnoses:  None    Rx / DC Orders ED Discharge Orders     None         Tanda Rockers, PA-C 06/12/21 2157    Tegeler, Canary Brim, MD 06/13/21 [phone]

## 2021-06-12 NOTE — ED Provider Notes (Signed)
Emergency Medicine Provider Triage Evaluation Note  Jasmine Daugherty , a 25 y.o. female  was evaluated in triage.  Pt complains of left-sided headache associated with "dizzy spells" x3 days.  Patient states she had a history of migraines when she was little however, none recently.  No visual changes, speech changes, nausea, or vomiting.  No recent head injury.  Patient had a scheduled MRI brain due to "spells" however, patient missed her appointment and did not have her MRI performed.  Denies fever and chills.  No neck stiffness.  Review of Systems  Positive: headache Negative: fever  Physical Exam  BP (!) 144/98 (BP Location: Right Arm)   Pulse 91   Temp 98.7 F (37.1 C) (Oral)   Resp 18   SpO2 98%  Gen:   Awake, no distress   Resp:  Normal effort  MSK:   Moves extremities without difficulty  Other:    Medical Decision Making  Medically screening exam initiated at 4:30 PM.  Appropriate orders placed.  Jasmine Daugherty was informed that the remainder of the evaluation will be completed by another provider, this initial triage assessment does not replace that evaluation, and the importance of remaining in the ED until their evaluation is complete.  Patient will need migraine cocktail in back.    Jesusita Oka 06/12/21 1631    Glendora Score, MD 06/12/21 [phone]

## 2021-06-12 NOTE — ED Triage Notes (Signed)
PT reports migraine for the past 3 days. Denies other symptoms.

## 2021-06-15 ENCOUNTER — Telehealth (HOSPITAL_COMMUNITY): Payer: 59 | Admitting: Psychiatry

## 2021-06-22 ENCOUNTER — Telehealth (HOSPITAL_COMMUNITY): Payer: 59 | Admitting: Psychiatry

## 2022-01-10 ENCOUNTER — Encounter (HOSPITAL_BASED_OUTPATIENT_CLINIC_OR_DEPARTMENT_OTHER): Payer: Self-pay

## 2022-01-10 ENCOUNTER — Emergency Department (HOSPITAL_BASED_OUTPATIENT_CLINIC_OR_DEPARTMENT_OTHER)
Admission: EM | Admit: 2022-01-10 | Discharge: 2022-01-10 | Disposition: A | Discharge status: Home / Self Care | Payer: 59 | Attending: Emergency Medicine | Admitting: Emergency Medicine

## 2022-01-10 ENCOUNTER — Other Ambulatory Visit: Payer: Self-pay

## 2022-01-10 DIAGNOSIS — R519 Headache, unspecified: Secondary | ICD-10-CM | POA: Diagnosis present

## 2022-01-10 DIAGNOSIS — R200 Anesthesia of skin: Secondary | ICD-10-CM | POA: Diagnosis not present

## 2022-01-10 DIAGNOSIS — R531 Weakness: Secondary | ICD-10-CM | POA: Insufficient documentation

## 2022-01-10 LAB — BASIC METABOLIC PANEL
Anion gap: 9 (ref 5–15)
BUN: 15 mg/dL (ref 6–20)
CO2: 24 mmol/L (ref 22–32)
Calcium: 9.5 mg/dL (ref 8.9–10.3)
Chloride: 103 mmol/L (ref 98–111)
Creatinine, Ser: 0.79 mg/dL (ref 0.44–1.00)
GFR, Estimated: >60 mL/min (ref >60)
Glucose, Bld: 107 mg/dL — ABNORMAL HIGH (ref 70–99)
Potassium: 3.8 mmol/L (ref 3.5–5.1)
Sodium: 136 mmol/L (ref 135–145)

## 2022-01-10 LAB — CBC WITH DIFFERENTIAL/PLATELET
Abs Immature Granulocytes: 0.02 10*3/uL (ref 0.00–0.07)
Basophils Absolute: 0 10*3/uL (ref 0.0–0.1)
Basophils Relative: 0 %
Eosinophils Absolute: 0 10*3/uL (ref 0.0–0.5)
Eosinophils Relative: 0 %
HCT: 37.5 % (ref 36.0–46.0)
Hemoglobin: 12.3 g/dL (ref 12.0–15.0)
Immature Granulocytes: 0 %
Lymphocytes Relative: 27 %
Lymphs Abs: 2.6 10*3/uL (ref 0.7–4.0)
MCH: 30.1 pg (ref 26.0–34.0)
MCHC: 32.8 g/dL (ref 30.0–36.0)
MCV: 91.7 fL (ref 80.0–100.0)
Monocytes Absolute: 0.7 10*3/uL (ref 0.1–1.0)
Monocytes Relative: 7 %
Neutro Abs: 6.3 10*3/uL (ref 1.7–7.7)
Neutrophils Relative %: 66 %
Platelets: 325 10*3/uL (ref 150–400)
RBC: 4.09 MIL/uL (ref 3.87–5.11)
RDW: 12.6 % (ref 11.5–15.5)
WBC: 9.6 10*3/uL (ref 4.0–10.5)
nRBC: 0 % (ref 0.0–0.2)

## 2022-01-10 MED ORDER — KETOROLAC TROMETHAMINE 15 MG/ML IJ SOLN
15.0000 mg | Freq: Once | INTRAMUSCULAR | Status: AC
  Administered 2022-01-10: 15 mg via INTRAVENOUS
  Filled 2022-01-10: qty 1

## 2022-01-10 MED ORDER — DIPHENHYDRAMINE HCL 50 MG/ML IJ SOLN
25.0000 mg | Freq: Once | INTRAMUSCULAR | Status: AC
  Administered 2022-01-10: 25 mg via INTRAVENOUS
  Filled 2022-01-10: qty 1

## 2022-01-10 MED ORDER — PROCHLORPERAZINE EDISYLATE 10 MG/2ML IJ SOLN
10.0000 mg | Freq: Once | INTRAMUSCULAR | Status: AC
  Administered 2022-01-10: 10 mg via INTRAVENOUS
  Filled 2022-01-10: qty 2

## 2022-01-10 NOTE — Discharge Instructions (Addendum)
We saw you in the ER for headaches.  ?We are not sure what is causing your headaches, however, there appears to be no evidence of infection, bleeds or tumors based on our exam -and you feel a lot better after the initial medications. ? ?Please take motrin or Tylenol round the clock for the next 48 hours. ?We have sent an ambulatory referral placed to [facility] neurology.  Expect a call from them tomorrow for an appointment.  If they do not call you, then please call them yourself to set up an appointment in 2 weeks. ? ?Please return to the ER if the headache gets severe and in not improving, you have associated new one sided numbness, tingling, weakness or confusion, seizures, poor balance or poor vision. ? ?

## 2022-01-10 NOTE — ED Triage Notes (Signed)
Pt presents with Left side facial numbness and headache starting approx 2;30 pm today. Pt denies sensitivity to light, N/V or vision changes  ? ?Pt ambulatory in triage. No neuro deficits noted   ?

## 2022-01-10 NOTE — ED Notes (Signed)
Provider notified

## 2022-01-10 NOTE — ED Provider Notes (Addendum)
?[facility] ?Provider Note ? ? ?CSN: [id number] ?Arrival date & time: 01/10/22  1733 ? ?  ? ?History ? ?Chief Complaint  ?Patient presents with  ? Headache  ? Numbness  ? ? ?Jasmine Daugherty is a 26 y.o. female. ? ?HPI ? ?  ? ?Pt presented today for left sided facial weakness. Pt's PMH includes migraines without aura but states today's pain is not like a migraine she has had in the past. Pt states that today at 2:30 p.m. she had left sided facial weakness that radiated to her left jaw. Pt states that the pain is intense and feels like "pins and needles". Pt has not taken anything that has alleviated the pain.  Patient felt like she was having difficulty concentrating and felt like she might drool.  She denies slurring of words, vision loss, focal weakness, tearing of the eyes, droopy eyelid, stuffy nose, weakness of her extremities. ? ?There is no family history of brain bleed, brain aneurysm. ?Patient has no history of clots. ? ? ?Home Medications ?Prior to Admission medications   ?Medication Sig Start Date End Date Taking? Authorizing Provider  ?FLUoxetine (PROZAC) 20 MG capsule Take 20 mg by mouth daily. 04/26/21   [provider]  ?lamoTRIgine (LAMICTAL) 25 MG tablet Take 1 tablet (25 mg total) by mouth daily. Take one tablet daily for a week and then start taking 2 tablets. 05/07/21   Merian Capron, MD  ?methocarbamol (ROBAXIN) 500 MG tablet Take 1 tablet (500 mg total) by mouth 2 (two) times daily. 02/23/21   Tedd Sias, PA  ?ondansetron (ZOFRAN ODT) 4 MG disintegrating tablet Take 1 tablet (4 mg total) by mouth every 8 (eight) hours as needed for nausea or vomiting. 02/23/21   Tedd Sias, PA  ?   ? ?Allergies    ?Tree extract   ? ?Review of Systems   ?Review of Systems  ?All other systems reviewed and are negative. ? ?Physical Exam ?Updated Vital Signs ?BP 113/75   Pulse 75   Temp 99.2 ?F (37.3 ?C) (Oral)   Resp 18   SpO2 100%  ?Physical Exam ?Vitals and nursing  note reviewed.  ?Constitutional:   ?   Appearance: She is well-developed.  ?HENT:  ?   Head: Atraumatic.  ?Eyes:  ?   General: No visual field deficit. ?Cardiovascular:  ?   Rate and Rhythm: Normal rate.  ?Pulmonary:  ?   Effort: Pulmonary effort is normal.  ?Musculoskeletal:  ?   Cervical back: Normal range of motion and neck supple.  ?   Comments: Patient has reproducible tenderness with palpation of the paracervical region on the left side  ?Skin: ?   General: Skin is warm and dry.  ?Neurological:  ?   Mental Status: She is alert and oriented to person, place, and time.  ?   GCS: GCS eye subscore is 4. GCS verbal subscore is 5. GCS motor subscore is 6.  ?   Cranial Nerves: No cranial nerve deficit, dysarthria or facial asymmetry.  ?   Sensory: Sensory deficit present.  ?   Comments: Subjective numbness to the left side of the face  ? ? ?ED Results / Procedures / Treatments   ?Labs ?(all labs ordered are listed, but only abnormal results are displayed) ?Labs Reviewed  ?BASIC METABOLIC PANEL - Abnormal; Notable for the following components:  ?    Result Value  ? Glucose, Bld 107 (*)   ? All other components within normal limits  ?  CBC WITH DIFFERENTIAL/PLATELET  ? ? ?EKG ?None ? ?Radiology ?No results found. ? ?Procedures ?Procedures  ? ? ?Medications Ordered in ED ?Medications  ?ketorolac (TORADOL) 15 MG/ML injection 15 mg (15 mg Intravenous Given 01/10/22 2102)  ?prochlorperazine (COMPAZINE) injection 10 mg (10 mg Intravenous Given 01/10/22 2102)  ?diphenhydrAMINE (BENADRYL) injection 25 mg (25 mg Intravenous Given 01/10/22 2102)  ? ? ?ED Course/ Medical Decision Making/ A&P ?  ?                        ?Medical Decision Making ?Amount and/or Complexity of Data Reviewed ?Labs: ordered. ? ?Risk ?Prescription drug management. ? ? ?DDX includes: ?Primary headaches - including migrainous headaches, cluster headaches, tension headaches. ?ICH ?Carotid dissection ?Cavernous sinus  thrombosis ?Meningitis ?Encephalitis ?Sinusitis ?Tumor ?Vascular headaches ?AV malformation ?Brain aneurysm ?Muscular headaches ? ?A/P: ?Pt comes in with cc of headaches. ?No concerns for life threatening secondary headaches because the headaches are not thunderclap in nature, patient does not have any risk factors for aneurysm, dissection and she has no objective focal neurodeficits.  She is complaining of some left-sided facial numbness that is associated with the headaches.  The headaches also over the entire left side of her face and head. ? ?Neuro exam including pupillary exam is normal. ? ?Plan is to get headache cocktail initiated. ? ?I did discuss the case with neuro hospitalist.  They recommend that if patient does not improve with a headache cocktail, then we proceed with a gram of the head and CT venogram of the head.  This test is to simply rule out any Emergent pathology.  If those studies were negative, then patient can be discharged for be sent to [facility] for [facility] if her symptoms are persistent. ? ?Patient was reassessed at about 10:45 PM.  She indicated that her headache had improved dramatically, it was down to about 2 or 3 out of 10.  She also feels that the numbness to her face is resolving. ? ?We discussed with her the recommendation from neurologist, she prefers getting outpatient neurology follow-up rather than getting CT scan now.  ? ?Strict ER return precautions have been discussed, and patient is agreeing with the plan and is comfortable with the workup done and the recommendations from the ER. ? ?Patient will return to the ER if she starts having worsening headaches, worsening numbness to her face or any kind of new neurologic disturbance. ? ?Final Clinical Impression(s) / ED Diagnoses ?Final diagnoses:  ?Bad headache  ? ? ?Rx / DC Orders ?ED Discharge Orders   ? ?      Ordered  ?  Ambulatory referral to Neurology       ?Comments: An appointment is requested in approximately: 2 weeks  ?  01/10/22 2249  ? ?  ?  ? ?  ? ? ?  ?Varney Biles, MD ?01/10/22 2304 ? ?

## 2022-01-10 NOTE — ED Notes (Signed)
Pt did not give this information when triaged and is just now mentioning same. Pt just added that she had started to drool at approx 1400. Pt stated that she was sitting down at work and realized that she had drooled some as if she had fallen asleep or has drank some water.Amount of droll was very small. Pt denis LOC and stated that her eyes were open the whole time and she stated she didn't know why she had drooled. Pt states that this was the only occurrence. ?

## 2022-01-25 ENCOUNTER — Ambulatory Visit (INDEPENDENT_AMBULATORY_CARE_PROVIDER_SITE_OTHER): Payer: 59 | Admitting: Neurology

## 2022-01-25 ENCOUNTER — Other Ambulatory Visit (HOSPITAL_COMMUNITY): Payer: Self-pay

## 2022-01-25 ENCOUNTER — Encounter: Payer: Self-pay | Admitting: Neurology

## 2022-01-25 VITALS — BP 132/83 | HR 79 | Ht 67.0 in | Wt 297.0 lb

## 2022-01-25 DIAGNOSIS — H538 Other visual disturbances: Secondary | ICD-10-CM | POA: Diagnosis not present

## 2022-01-25 DIAGNOSIS — R413 Other amnesia: Secondary | ICD-10-CM

## 2022-01-25 DIAGNOSIS — R519 Headache, unspecified: Secondary | ICD-10-CM

## 2022-01-25 DIAGNOSIS — H547 Unspecified visual loss: Secondary | ICD-10-CM | POA: Diagnosis not present

## 2022-01-25 DIAGNOSIS — R51 Headache with orthostatic component, not elsewhere classified: Secondary | ICD-10-CM | POA: Diagnosis not present

## 2022-01-25 DIAGNOSIS — G43009 Migraine without aura, not intractable, without status migrainosus: Secondary | ICD-10-CM

## 2022-01-25 DIAGNOSIS — R5383 Other fatigue: Secondary | ICD-10-CM

## 2022-01-25 DIAGNOSIS — R200 Anesthesia of skin: Secondary | ICD-10-CM

## 2022-01-25 MED ORDER — MOMETASONE FUROATE 50 MCG/ACT NA SUSP
2.0000 | Freq: Every day | NASAL | 0 refills | Status: DC
  Filled 2022-01-25: qty 17, 30d supply, fill #0

## 2022-01-25 MED ORDER — RIZATRIPTAN BENZOATE 10 MG PO TBDP
10.0000 mg | ORAL_TABLET | ORAL | 11 refills | Status: DC | PRN
  Filled 2022-01-25: qty 9, 30d supply, fill #0

## 2022-01-25 NOTE — Patient Instructions (Addendum)
MRI brain w/wo contrast ? ?Acutely: Rizatriptan: Please take one tablet at the onset of your headache. If it does not improve the symptoms please take one additional tablet. Do not take more then 2 tablets in 24hrs. Do not take use more then 2 to 3 times in a week. ? ?If rizatriptan is not effective, try Ubrelvy or Nurtec and can call for prescription ? ?Rizatriptan Disintegrating Tablets ?What is this medication? ?RIZATRIPTAN (rye za TRIP tan) treats migraines. It works by blocking pain signals and narrowing blood vessels in the brain. It belongs to a group of medications called triptans. It is not used to prevent migraines. ?This medicine may be used for other purposes; ask your health care provider or pharmacist if you have questions. ?COMMON BRAND NAME(S): Maxalt-MLT ?What should I tell my care team before I take this medication? ?They need to know if you have any of these conditions: ?Cigarette smoker ?Circulation problems in fingers and toes ?Diabetes ?Heart disease ?High blood pressure ?High cholesterol ?History of irregular heartbeat ?History of stroke ?Kidney disease ?Liver disease ?Stomach or intestine problems ?An unusual or allergic reaction to rizatriptan, other medications, foods, dyes, or preservatives ?Pregnant or trying to get pregnant ?Breast-feeding ?How should I use this medication? ?Take this medication by mouth. Follow the directions on the prescription label. Leave the tablet in the sealed blister pack until you are ready to take it. With dry hands, open the blister and gently remove the tablet. If the tablet breaks or crumbles, throw it away and take a new tablet out of the blister pack. Place the tablet in the mouth and allow it to dissolve, and then swallow. Do not cut, crush, or chew this medication. You do not need water to take this medication. Do not take it more often than directed. ?Talk to your care team regarding the use of this medication in children. While this medication may be  prescribed for children as young as 6 years for selected conditions, precautions do apply. ?Overdosage: If you think you have taken too much of this medicine contact a poison control center or emergency room at once. ?NOTE: This medicine is only for you. Do not share this medicine with others. ?What if I miss a dose? ?This does not apply. This medication is not for regular use. ?What may interact with this medication? ?Do not take this medication with any of the following medications: ?Certain medications for migraine headache like almotriptan, eletriptan, frovatriptan, naratriptan, rizatriptan, sumatriptan, zolmitriptan ?Ergot alkaloids like dihydroergotamine, ergonovine, ergotamine, methylergonovine ?MAOIs like Carbex, Eldepryl, Marplan, Nardil, and Parnate ?This medication may also interact with the following medications: ?Certain medications for depression, anxiety, or psychotic disorders ?Propranolol ?This list may not describe all possible interactions. Give your health care provider a list of all the medicines, herbs, non-prescription drugs, or dietary supplements you use. Also tell them if you smoke, drink alcohol, or use illegal drugs. Some items may interact with your medicine. ?What should I watch for while using this medication? ?Visit your care team for regular checks on your progress. Tell your care team if your symptoms do not start to get better or if they get worse. ?You may get drowsy or dizzy. Do not drive, use machinery, or do anything that needs mental alertness until you know how this medication affects you. Do not stand up or sit up quickly, especially if you are an older patient. This reduces the risk of dizzy or fainting spells. Alcohol may interfere with the effect of this medication. ?  Your mouth may get dry. Chewing sugarless gum or sucking hard candy and drinking plenty of water may help. Contact your care team if the problem does not go away or is severe. ?If you take migraine  medications for 10 or more days a month, your migraines may get worse. Keep a diary of headache days and medication use. Contact your care team if your migraine attacks occur more frequently. ?What side effects may I notice from receiving this medication? ?Side effects that you should report to your care team as soon as possible: ?Allergic reactions--skin rash, itching, hives, swelling of the face, lips, tongue, or throat ?Burning, pain, tingling, or color changes in the legs or feet ?Heart attack--pain or tightness in the chest, shoulders, arms, or jaw, nausea, shortness of breath, cold or clammy skin, feeling faint or lightheaded ?Heart rhythm changes--fast or irregular heartbeat, dizziness, feeling faint or lightheaded, chest pain, trouble breathing ?Increase in blood pressure ?Irritability, confusion, fast or irregular heartbeat, muscle stiffness, twitching muscles, sweating, high fever, seizure, chills, vomiting, diarrhea, which may be signs of serotonin syndrome ?Raynaud's--cool, numb, or painful fingers or toes that may change color from pale, to blue, to red ?Seizures ?Stroke--sudden numbness or weakness of the face, arm, or leg, trouble speaking, confusion, trouble walking, loss of balance or coordination, dizziness, severe headache, change in vision ?Sudden or severe stomach pain, nausea, vomiting, fever, or bloody diarrhea ?Vision loss ?Side effects that usually do not require medical attention (report to your care team if they continue or are bothersome): ?Dizziness ?General discomfort or fatigue ?This list may not describe all possible side effects. Call your doctor for medical advice about side effects. You may report side effects to FDA at 1-800-FDA-1088. ?Where should I keep my medication? ?Keep out of the reach of children and pets. ?Store at room temperature between 15 and 30 degrees C (59 and 86 degrees F). Protect from light and moisture. Throw away any unused medication after the expiration  date. ?NOTE: This sheet is a summary. It may not cover all possible information. If you have questions about this medicine, talk to your doctor, pharmacist, or health care provider. ?? 2023 Elsevier/Gold Standard (2020-10-27 00:00:00) ? ? ?Migraine Headache ?A migraine headache is an intense, throbbing pain on one side or both sides of the head. Migraine headaches may also cause other symptoms, such as nausea, vomiting, and sensitivity to light and noise. A migraine headache can last from 4 hours to 3 days. Talk with your doctor about what things may bring on (trigger) your migraine headaches. ?What are the causes? ?The exact cause of this condition is not known. However, a migraine may be caused when nerves in the brain become irritated and release chemicals that cause inflammation of blood vessels. This inflammation causes pain. This condition may be triggered or caused by: ?Drinking alcohol. ?Smoking. ?Taking medicines, such as: ?Medicine used to treat chest pain (nitroglycerin). ?Birth control pills. ?Estrogen. ?Certain blood pressure medicines. ?Eating or drinking products that contain nitrates, glutamate, aspartame, or tyramine. Aged cheeses, chocolate, or caffeine may also be triggers. ?Doing physical activity. ?Other things that may trigger a migraine headache include: ?Menstruation. ?Pregnancy. ?Hunger. ?Stress. ?Lack of sleep or too much sleep. ?Weather changes. ?Fatigue. ?What increases the risk? ?The following factors may make you more likely to experience migraine headaches: ?Being a certain age. This condition is more common in people who are 35-68 years old. ?Being female. ?Having a family history of migraine headaches. ?Being Caucasian. ?Having a mental health condition,  such as depression or anxiety. ?Being obese. ?What are the signs or symptoms? ?The main symptom of this condition is pulsating or throbbing pain. This pain may: ?Happen in any area of the head, such as on one side or both  sides. ?Interfere with daily activities. ?Get worse with physical activity. ?Get worse with exposure to bright lights or loud noises. ?Other symptoms may include: ?Nausea. ?Vomiting. ?Dizziness. ?General sensitivity to bright

## 2022-01-25 NOTE — Progress Notes (Signed)
?[facility] ? ? ? ?Provider:  Dr Lucia Gaskins ?Requesting Provider: Derwood Kaplan, MD ?Primary Care Provider:  Norm Salt, PA ? ?CC:  migraines ? ?HPI:  Jasmine Daugherty is a 26 y.o. female here as requested by Derwood Kaplan, MD for headaches. PMHx asthma and migraines. She reports that she was in the emergency room for headaches and facial numbness. Patient was seen in the emergency room in April 2023, she presented for left-sided facial weakness, headache, in the emergency room she reported the pain was not like a migraine she had in the past, she had left-sided facial weakness that radiated to her left jaw, intense pain like pins-and-needles, she denied any other focal neurologic symptoms.  She was given Toradol, Compazine, Benadryl with improvement.  Examination and lab work were reassuring, she had some left-sided cervical tenderness in the paraspinal area.  Neurologic exam was normal.  Her symptoms resolved with the migraine cocktail. ? ?She gets migraines once every 2-3 months but they can be severe and getting worse. They start in the back of her head, severe, she can;t even stand up, radiates to the front of the head behind the eyes, can be unilateral but either side, but can be all over, pulsating/throbbing, light and sound sensitivity, no significant nausea, no vomiting, no auras. Pain can be so severe feel like she is blacking out but no loss of consciousness. They can last several hours, sleep helps but if she is at work she can't, up to 4 hours even if not treated, tylenol helps but doesn't take it away completely, these are the different symptoms, more severe worsening pain never had facial numbness before. She has seen neurologist in the past, she was 14 when they started. Imitrex in the past did not work, that's all she has evert tried, she tried magnesium. She had lost vision with her migraines, also had facial numbness, headaches can be positional and worse changing  positions, blurry vision, recently the migraine have become more severe than she has ever experienced in her life, and wuality changed with numbness in face, also memory problems. No other focal neurologic deficits, associated symptoms, inciting events or modifiable factors. ? ? ?Reviewed notes, labs and imaging from outside physicians, which showed: Tylenol, Benadryl, Prozac, ibuprofen, Toradol injections, Lamictal, Robaxin, Solu-Medrol, Zofran, prednisone tablets, Imitrex, maxalt ? ?CT head 06/12/2021: showed No acute intracranial abnormalities including mass lesion or mass effect, hydrocephalus, extra-axial fluid collection, midline shift, hemorrhage, or acute infarction, large ischemic events (personally reviewed images) ? ?Cbc/bmp 12/2021 normal. ? ?Review of Systems: ?Patient complains of symptoms per HPI as well as the following symptoms headaches. Pertinent negatives and positives per HPI. All others negative. ? ? ?Social History  ? ?Socioeconomic History  ? Marital status: Single  ?  Spouse name: Not on file  ? Number of children: Not on file  ? Years of education: Not on file  ? Highest education level: Not on file  ?Occupational History  ? Not on file  ?Tobacco Use  ? Smoking status: Never  ? Smokeless tobacco: Never  ?Vaping Use  ? Vaping Use: Never used  ?Substance and Sexual Activity  ? Alcohol use: Not Currently  ?  Comment: occasional  ? Drug use: No  ? Sexual activity: Yes  ?  Birth control/protection: None  ?Other Topics Concern  ? Not on file  ?Social History Narrative  ? Lives with her mom   ? Left handed  ? Caffeine: coffee daily, a lot. Occasional soda. Drinks  plenty of water.  ? ?Social Determinants of Health  ? ?Financial Resource Strain: Not on file  ?Food Insecurity: Not on file  ?Transportation Needs: Not on file  ?Physical Activity: Not on file  ?Stress: Not on file  ?Social Connections: Not on file  ?Intimate Partner Violence: Not on file  ? ? ?Family History  ?Problem Relation Age of  Onset  ? Migraines Neg Hx   ? ? ?Past Medical History:  ?Diagnosis Date  ? Asthma   ? Migraine   ? ? ?Patient Active Problem List  ? Diagnosis Date Noted  ? Contusion of left patella 09/13/2020  ? Migraine 11/20/2017  ? ? ?Past Surgical History:  ?Procedure Laterality Date  ? NO PAST SURGERIES    ? ? ?Current Outpatient Medications  ?Medication Sig Dispense Refill  ? rizatriptan (MAXALT-MLT) 10 MG disintegrating tablet Take 1 tablet (10 mg total) by mouth as needed for migraine. May repeat in 2 hours if needed 9 tablet 11  ? ?No current facility-administered medications for this visit.  ? ? ?Allergies as of 01/25/2022 - Review Complete 01/25/2022  ?Allergen Reaction Noted  ? Tree extract Anaphylaxis 03/28/2018  ? ? ?Vitals: ?BP 132/83 (BP Location: Right Arm, Patient Position: Sitting)   Pulse 79   Ht 5\' 7"  (1.702 m)   Wt 297 lb (134.7 kg)   LMP 01/19/2022 (Exact Date)   BMI 46.52 kg/m?  ?Last Weight:  ?Wt Readings from Last 1 Encounters:  ?01/25/22 297 lb (134.7 kg)  ? ?Last Height:   ?Ht Readings from Last 1 Encounters:  ?01/25/22 5\' 7"  (1.702 m)  ? ? ? ?Physical exam: ?Exam: ?Gen: NAD, conversant, well nourised, obese, well groomed                     ?CV: RRR, no MRG. No Carotid Bruits. No peripheral edema, warm, nontender ?Eyes: Conjunctivae clear without exudates or hemorrhage ? ?Neuro: ?Detailed Neurologic Exam ? ?Speech: ?   Speech is normal; fluent and spontaneous with normal comprehension.  ?Cognition: ?   The patient is oriented to person, place, and time;  ?   recent and remote memory intact;  ?   language fluent;  ?   normal attention, concentration,  ?   fund of knowledge ?Cranial Nerves: ?   The pupils are equal, round, and reactive to light. The fundi are flat. Extraocular movements are intact. Trigeminal sensation is intact and the muscles of mastication are normal. The face is symmetric. The palate elevates in the midline. Hearing intact. Voice is normal. Shoulder shrug is normal. The tongue  has normal motion without fasciculations.  ? ?Coordination: ?   Normal  ? ?Gait: ?   normal.  ? ?Motor Observation: ?   No asymmetry, no atrophy, and no involuntary movements noted. ?Tone: ?   Normal muscle tone.   ? ?Posture: ?   Posture is normal. normal erect ?   ?Strength: ?   Strength is V/V in the upper and lower limbs.  ?    ?Sensation: intact to LT ?    ?Reflex Exam: ? ?DTR's: ?   Deep tendon reflexes in the upper and lower extremities are normal bilaterally.   ?Toes: ?   The toes are downgoing bilaterally.   ?Clonus: ?   Clonus is absent. ?  ? ?Assessment/Plan:  Patient with episodic migraines however due to concerning symptoms needs brain imaging.  ? ?Acutely: Rizatriptan: Please take one tablet at the onset of your headache. If  it does not improve the symptoms please take one additional tablet. Do not take more then 2 tablets in 24hrs. Do not take use more then 2 to 3 times in a week. ? ?MRI brain due to concerning symptoms of worsening severity of headaches, positional headaches,vision changes, vision loss, worsening severity headaches  to look for space occupying mass, chiari or intracranial hypertension (pseudotumor), strokes, malignancies, vasculidities, demyelination(multiple sclerosis) or other ? ? ?Orders Placed This Encounter  ?Procedures  ? MR BRAIN W WO CONTRAST  ? TSH  ? T4, Free  ? ?Meds ordered this encounter  ?Medications  ? rizatriptan (MAXALT-MLT) 10 MG disintegrating tablet  ?  Sig: Take 1 tablet (10 mg total) by mouth as needed for migraine. May repeat in 2 hours if needed  ?  Dispense:  9 tablet  ?  Refill:  11  ? ? ?Cc: Derwood Kaplan, MD,  Norm Salt, PA ? ?Naomie Dean, MD ? ?[facility] ?[address] ?[place], [place] [address] ? ?Phone [phone] Fax [phone] ? ?

## 2022-01-26 LAB — TSH: TSH: 1.39 u[IU]/mL (ref 0.450–4.500)

## 2022-01-26 LAB — T4, FREE: Free T4: 1.07 ng/dL (ref 0.82–1.77)

## 2022-02-02 ENCOUNTER — Telehealth: Payer: Self-pay | Admitting: Neurology

## 2022-02-02 NOTE — Telephone Encounter (Signed)
[facility] auth: NPR ref # [id number]. I spoke with the patient she is scheduled at [facility] for 02/08/22. ? ? ?She informed me she is claustrophobic and would like something to help her. She is aware to have a drive.r  ?

## 2022-02-06 ENCOUNTER — Other Ambulatory Visit: Payer: Self-pay | Admitting: Neurology

## 2022-02-06 MED ORDER — ALPRAZOLAM 0.25 MG PO TABS: ORAL_TABLET | ORAL | 0 refills | Status: DC

## 2022-02-07 ENCOUNTER — Ambulatory Visit: Payer: Medicaid Other | Admitting: Neurology

## 2022-02-07 NOTE — Telephone Encounter (Signed)
Patient called to reschedule her MRI that was going to be tomorrow and said that in her visit with [place] she was told that her insurance would cover the scan and is confused why her estimate came back at $1,580. She was advised she can pay $75 at check-in and do a payment plan for the remaining balance, but states she was told by Dr. Lucia Gaskins it would be covered and she does not want to spend that much money on a MRI. Please advise. ?

## 2022-02-08 ENCOUNTER — Other Ambulatory Visit: Payer: 59

## 2022-02-14 ENCOUNTER — Ambulatory Visit: Payer: Medicaid Other | Admitting: Neurology

## 2022-02-22 ENCOUNTER — Encounter: Payer: Self-pay | Admitting: Neurology

## 2022-02-22 NOTE — Telephone Encounter (Signed)
45 mins MRI brain w/wo contrast Dr. Valentino Saxon Elease Hashimoto: [id number] scheduled at [facility] 03/01/22 at 3:45pm.  Patient said that her boss is going to help her pay for the MRI, requested a letter with the estimated costs. I will send it to her over mychart. She knows to pay $75 at check in.

## 2022-03-01 ENCOUNTER — Other Ambulatory Visit: Payer: 59

## 2022-05-16 ENCOUNTER — Emergency Department: Payer: 59

## 2022-05-16 ENCOUNTER — Emergency Department
Admission: EM | Admit: 2022-05-16 | Discharge: 2022-05-16 | Disposition: A | Discharge status: Home / Self Care | Payer: 59 | Attending: Emergency Medicine | Admitting: Emergency Medicine

## 2022-05-16 ENCOUNTER — Other Ambulatory Visit: Payer: Self-pay

## 2022-05-16 ENCOUNTER — Encounter: Payer: Self-pay | Admitting: Emergency Medicine

## 2022-05-16 DIAGNOSIS — J45909 Unspecified asthma, uncomplicated: Secondary | ICD-10-CM | POA: Insufficient documentation

## 2022-05-16 DIAGNOSIS — R0789 Other chest pain: Secondary | ICD-10-CM | POA: Diagnosis not present

## 2022-05-16 DIAGNOSIS — R002 Palpitations: Secondary | ICD-10-CM | POA: Insufficient documentation

## 2022-05-16 LAB — CBC
HCT: 38.4 % (ref 36.0–46.0)
Hemoglobin: 12.4 g/dL (ref 12.0–15.0)
MCH: 30.2 pg (ref 26.0–34.0)
MCHC: 32.3 g/dL (ref 30.0–36.0)
MCV: 93.7 fL (ref 80.0–100.0)
Platelets: 360 10*3/uL (ref 150–400)
RBC: 4.1 MIL/uL (ref 3.87–5.11)
RDW: 12.4 % (ref 11.5–15.5)
WBC: 6.3 10*3/uL (ref 4.0–10.5)
nRBC: 0 % (ref 0.0–0.2)

## 2022-05-16 LAB — BASIC METABOLIC PANEL
Anion gap: 7 (ref 5–15)
BUN: 10 mg/dL (ref 6–20)
CO2: 25 mmol/L (ref 22–32)
Calcium: 9.3 mg/dL (ref 8.9–10.3)
Chloride: 109 mmol/L (ref 98–111)
Creatinine, Ser: 0.79 mg/dL (ref 0.44–1.00)
GFR, Estimated: >60 mL/min (ref >60)
Glucose, Bld: 88 mg/dL (ref 70–99)
Potassium: 3.6 mmol/L (ref 3.5–5.1)
Sodium: 141 mmol/L (ref 135–145)

## 2022-05-16 LAB — TROPONIN I (HIGH SENSITIVITY)
Troponin I (High Sensitivity): <2 ng/L (ref <18)
Troponin I (High Sensitivity): <2 ng/L (ref <18)

## 2022-05-16 LAB — POC URINE PREG, ED: Preg Test, Ur: NEGATIVE

## 2022-05-16 NOTE — ED Provider Triage Note (Signed)
Emergency Medicine Provider Triage Evaluation Note  Tashanna Dolin , a 26 y.o. female  was evaluated in triage.  Pt complains of left-sided chest pain.  Started this morning.  No birth control pill use, non-smoker, no recent travel.  Review of Systems  Positive:  Negative:   Physical Exam  BP (!) 133/92   Pulse 74   Temp 98.4 F (36.9 C) (Oral)   Resp 18   Ht 5\' 7"  (1.702 m)   Wt 124.7 kg   SpO2 100%   BMI 43.07 kg/m  Gen:   Awake, no distress   Resp:  Normal effort  MSK:   Moves extremities without difficulty  Other:    Medical Decision Making  Medically screening exam initiated at 3:07 PM.  Appropriate orders placed.  Charmon Thorson was informed that the remainder of the evaluation will be completed by another provider, this initial triage assessment does not replace that evaluation, and the importance of remaining in the ED until their evaluation is complete.  Chest pain protocols initiated by nursing staff   [name], PA-C 05/16/22 1508

## 2022-05-16 NOTE — ED Provider Notes (Signed)
[facility] Provider Note    Event Date/Time   First MD Initiated Contact with Patient 05/16/22 1629     (approximate)   History   Chief Complaint Irregular Heart Beat   HPI  Kinda Pottle is a 26 y.o. female with past medical history of asthma and migraines who presents to the ED complaining of chest pain.  Patient ports that last night she had an episode of tightness in her chest lasting for couple hours before resolving.  She was able to get some sleep and felt fine when she woke up this morning, but while at work had another similar episode of chest tightness.  Since onset of symptoms, she has felt like her heart is skipping a beat at times, but she denies any difficulty breathing.  She reports some mild tightness and discomfort in her chest currently, but has improved from earlier today.  She denies any associated fevers or cough, has not noticed any pain or swelling in her legs.  She does not take any medications on a regular basis, does not take birth control.     Physical Exam   Triage Vital Signs: ED Triage Vitals [05/16/22 1452]  Enc Vitals Group     BP (!) 133/92     Pulse Rate 74     Resp 18     Temp 98.4 F (36.9 C)     Temp Source Oral     SpO2 100 %     Weight 275 lb (124.7 kg)     Height 5\' 7"  (1.702 m)     Head Circumference      Peak Flow      Pain Score 0     Pain Loc      Pain Edu?      Excl. in GC?     Most recent vital signs: Vitals:   05/16/22 1631 05/16/22 1811  BP: (!) 111/90 131/82  Pulse: 82 74  Resp: 19 18  Temp:  98.1 F (36.7 C)  SpO2: 100% 100%    Constitutional: Alert and oriented. Eyes: Conjunctivae are normal. Head: Atraumatic. Nose: No congestion/rhinnorhea. Mouth/Throat: Mucous membranes are moist.  Cardiovascular: Normal rate, regular rhythm. Grossly normal heart sounds.  2+ radial pulses bilaterally. Respiratory: Normal respiratory effort.  No retractions. Lungs CTAB.  No chest wall tenderness  to palpation. Gastrointestinal: Soft and nontender. No distention. Musculoskeletal: No lower extremity tenderness nor edema.  Neurologic:  Normal speech and language. No gross focal neurologic deficits are appreciated.    ED Results / Procedures / Treatments   Labs (all labs ordered are listed, but only abnormal results are displayed) Labs Reviewed  BASIC METABOLIC PANEL  CBC  POC URINE PREG, ED  TROPONIN I (HIGH SENSITIVITY)  TROPONIN I (HIGH SENSITIVITY)     EKG  ED ECG REPORT I, 05/18/22, the attending physician, personally viewed and interpreted this ECG.   Date: 05/16/2022  EKG Time: 15:00  Rate: 80  Rhythm: normal sinus rhythm  Axis: Normal  Intervals:none  ST&T Change: None  RADIOLOGY Chest x-ray reviewed and interpreted by me with no infiltrate, edema, or effusion.  PROCEDURES:  Critical Care performed: No  Procedures   MEDICATIONS ORDERED IN ED: Medications - No data to display   IMPRESSION / MDM / ASSESSMENT AND PLAN / ED COURSE  I reviewed the triage vital signs and the nursing notes.  26 y.o. female with past medical history of asthma and migraines who presents to the ED complaining of chest pain and palpitations intermittently since last night.  Patient's presentation is most consistent with acute presentation with potential threat to life or bodily function.  Differential diagnosis includes, but is not limited to, arrhythmia, ACS, PE, pneumonia, pneumothorax, GERD, musculoskeletal pain, and anxiety.  Patient well-appearing and in no acute distress, vital signs are unremarkable.  EKG shows no evidence of arrhythmia or ischemia and initial troponin is negative.  Given intermittent symptoms, we will observe on cardiac monitor and check repeat troponin.  Chest x-ray is unremarkable and remainder of labs are reassuring with no significant anemia, leukocytosis, electrolyte abnormality, or AKI.  I doubt PE given  patient is PERC negative.  Pregnancy testing is pending.  Repeat troponin within normal limits, no arrhythmia noted on cardiac monitor.  Patient now states she is asymptomatic and she is appropriate for discharge home with PCP follow-up.  She was counseled to return to the ED for new or worsening symptoms, patient agrees with plan.      FINAL CLINICAL IMPRESSION(S) / ED DIAGNOSES   Final diagnoses:  Palpitations  Chest tightness     Rx / DC Orders   ED Discharge Orders     None        Note:  This document was prepared using Dragon voice recognition software and may include unintentional dictation errors.   Chesley Noon, MD 05/16/22 [phone]

## 2022-05-16 NOTE — ED Notes (Signed)
Pt denies chest pain orsob.  Nonsmoker.  Pt reports tightness in chest.  Pt also states last night it felt like her heart was skipping a beat.   Nsr on monitor.  Pt alert.

## 2022-05-16 NOTE — ED Triage Notes (Signed)
Patient to ED for irregular heart beat. Patient states last night it felt like her heart was skipping a beat. This AM at working patient states she started to feel chest discomfort.

## 2022-05-16 NOTE — ED Notes (Signed)
Poct pregnancy Negative 

## 2022-05-16 NOTE — ED Notes (Signed)
Pt ambulatory to rm. Toileting offered. Pt placed on cardiac monitor. Call light within reach. Pt has no further needs at this time.

## 2022-05-29 IMAGING — CR DG FINGER LITTLE 2+V*L*
3 series · 3 of 3 positions shown · non-contrast
Comparison: None.

CLINICAL DATA: Left fifth finger swelling.

EXAM:
LEFT LITTLE FINGER 2+V

[x finger pa left]
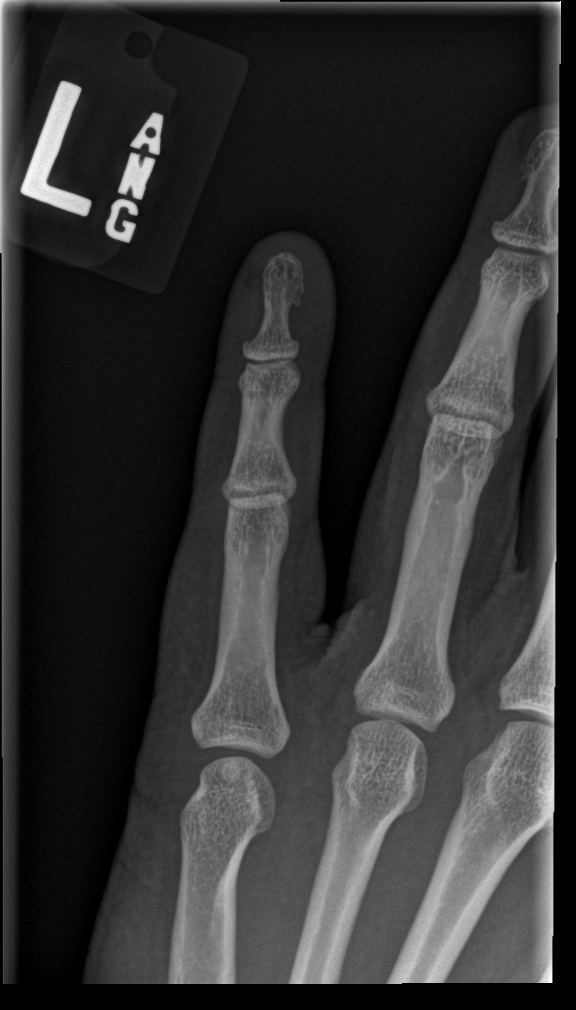

[x finger obl left]
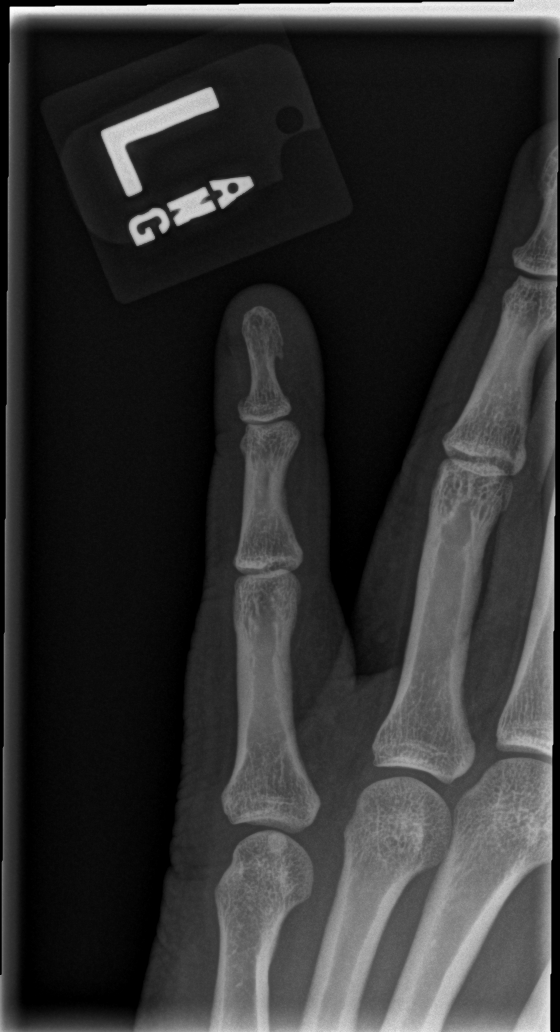

[x finger lat left]
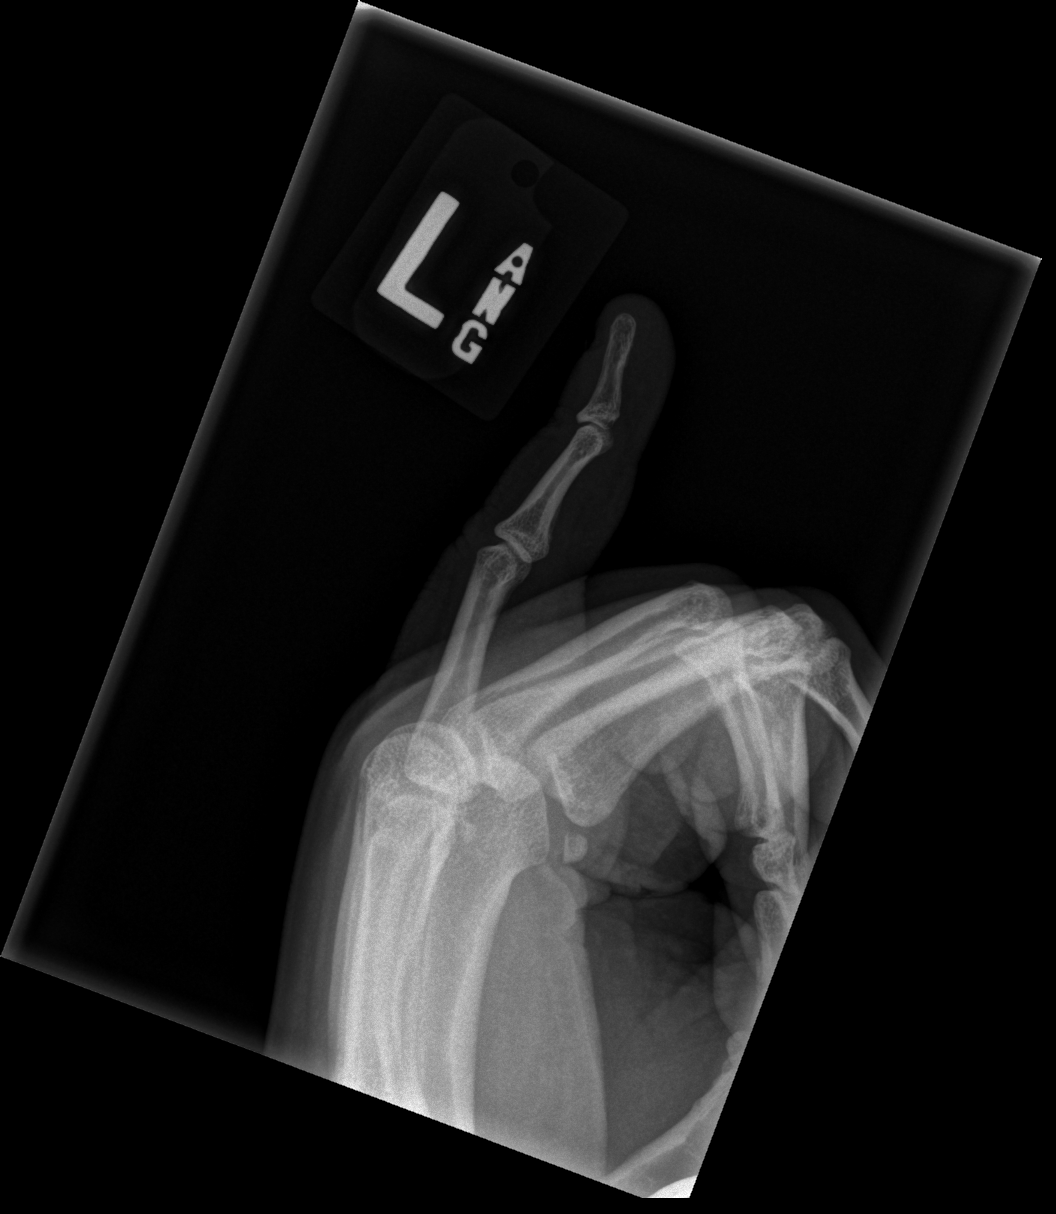

[3 of 3 positions shown; findings below may reference images not displayed]

FINDINGS: There is no evidence of fracture or dislocation. There is no
evidence of arthropathy or other focal bone abnormality. Soft
tissues are unremarkable.
IMPRESSION: Negative.

## 2022-07-31 ENCOUNTER — Telehealth: Payer: 59 | Admitting: Neurology

## 2022-08-25 ENCOUNTER — Emergency Department (HOSPITAL_COMMUNITY)
Admission: EM | Admit: 2022-08-25 | Discharge: 2022-08-25 | Disposition: A | Discharge status: Home / Self Care | Payer: 59

## 2022-08-25 NOTE — ED Notes (Signed)
Pt reports she would no longer like to be seen because Dr. Jeraldine Loots is leaving and she states she will be going to check in at [facility]. Pt A&Ox4 ambulatory with independent steady gait.

## 2022-09-13 ENCOUNTER — Encounter: Payer: Self-pay | Admitting: Internal Medicine

## 2022-09-13 ENCOUNTER — Other Ambulatory Visit (HOSPITAL_COMMUNITY): Payer: Self-pay

## 2022-09-13 ENCOUNTER — Ambulatory Visit (INDEPENDENT_AMBULATORY_CARE_PROVIDER_SITE_OTHER): Payer: Self-pay | Admitting: Internal Medicine

## 2022-09-13 ENCOUNTER — Other Ambulatory Visit (INDEPENDENT_AMBULATORY_CARE_PROVIDER_SITE_OTHER): Payer: Self-pay

## 2022-09-13 VITALS — BP 126/78 | HR 68 | Ht 67.0 in | Wt 283.2 lb

## 2022-09-13 DIAGNOSIS — R197 Diarrhea, unspecified: Secondary | ICD-10-CM

## 2022-09-13 DIAGNOSIS — R109 Unspecified abdominal pain: Secondary | ICD-10-CM

## 2022-09-13 DIAGNOSIS — R1013 Epigastric pain: Secondary | ICD-10-CM

## 2022-09-13 DIAGNOSIS — E66813 Obesity, class 3: Secondary | ICD-10-CM

## 2022-09-13 DIAGNOSIS — Z6841 Body Mass Index (BMI) 40.0 and over, adult: Secondary | ICD-10-CM

## 2022-09-13 DIAGNOSIS — K219 Gastro-esophageal reflux disease without esophagitis: Secondary | ICD-10-CM

## 2022-09-13 DIAGNOSIS — R112 Nausea with vomiting, unspecified: Secondary | ICD-10-CM

## 2022-09-13 LAB — COMPREHENSIVE METABOLIC PANEL
ALT: 14 U/L (ref 0–35)
AST: 17 U/L (ref 0–37)
Albumin: 4.5 g/dL (ref 3.5–5.2)
Alkaline Phosphatase: 56 U/L (ref 39–117)
BUN: 9 mg/dL (ref 6–23)
CO2: 29 mEq/L (ref 19–32)
Calcium: 9.6 mg/dL (ref 8.4–10.5)
Chloride: 102 mEq/L (ref 96–112)
Creatinine, Ser: 0.73 mg/dL (ref 0.40–1.20)
GFR: 114.63 mL/min (ref >60.00)
Glucose, Bld: 87 mg/dL (ref 70–99)
Potassium: 3.8 mEq/L (ref 3.5–5.1)
Sodium: 138 mEq/L (ref 135–145)
Total Bilirubin: 0.7 mg/dL (ref 0.2–1.2)
Total Protein: 7.6 g/dL (ref 6.0–8.3)

## 2022-09-13 LAB — CBC WITH DIFFERENTIAL/PLATELET
Basophils Absolute: 0 10*3/uL (ref 0.0–0.1)
Basophils Relative: 0.4 % (ref 0.0–3.0)
Eosinophils Absolute: 0.1 10*3/uL (ref 0.0–0.7)
Eosinophils Relative: 1.6 % (ref 0.0–5.0)
HCT: 39.4 % (ref 36.0–46.0)
Hemoglobin: 13.3 g/dL (ref 12.0–15.0)
Lymphocytes Relative: 44.5 % (ref 12.0–46.0)
Lymphs Abs: 2.4 10*3/uL (ref 0.7–4.0)
MCHC: 33.8 g/dL (ref 30.0–36.0)
MCV: 93 fl (ref 78.0–100.0)
Monocytes Absolute: 0.5 10*3/uL (ref 0.1–1.0)
Monocytes Relative: 8.8 % (ref 3.0–12.0)
Neutro Abs: 2.4 10*3/uL (ref 1.4–7.7)
Neutrophils Relative %: 44.7 % (ref 43.0–77.0)
Platelets: 319 10*3/uL (ref 150.0–400.0)
RBC: 4.24 Mil/uL (ref 3.87–5.11)
RDW: 13.2 % (ref 11.5–15.5)
WBC: 5.4 10*3/uL (ref 4.0–10.5)

## 2022-09-13 LAB — LIPASE: Lipase: 6 U/L — ABNORMAL LOW (ref 11.0–59.0)

## 2022-09-13 LAB — HIGH SENSITIVITY CRP: CRP, High Sensitivity: 3.59 mg/L (ref 0.000–5.000)

## 2022-09-13 MED ORDER — DICYCLOMINE HCL 20 MG PO TABS
20.0000 mg | ORAL_TABLET | ORAL | 3 refills | Status: DC | PRN
  Filled 2022-09-13: qty 30, 5d supply, fill #0

## 2022-09-13 MED ORDER — PANTOPRAZOLE SODIUM 40 MG PO TBEC
40.0000 mg | DELAYED_RELEASE_TABLET | Freq: Every day | ORAL | 3 refills | Status: DC
  Filled 2022-09-13: qty 90, 90d supply, fill #0

## 2022-09-13 NOTE — Progress Notes (Signed)
HISTORY OF PRESENT ILLNESS:  Jasmine Daugherty is a pleasant 26 y.o. 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  Patient reports developing problems with upper abdominal pain approximately 2 months ago.  She describes this in the upper mid abdomen without radiation.  Typically last 30 minutes.  Occurs at any time during the day.  The frequency is approximately every other day.  This has not woken her from sleep.  She has noticed decreased appetite and p.o. intake with 25 pound weight loss over the past 6 months.  With the pain she does have reliable nausea.  She did vomit on 1 occasion.  She does have a history of GERD which she has taken Tums on demand.  She also reports to me over the past month issues with diarrhea.  She does have daily bowel movements.  However, about every other day she will notice lower abdominal cramping followed by loose stools.  There is an element of urgency.  No bleeding.  Blood work from May 16, 2022 shows normal basic metabolic panel.  Normal CBC with hemoglobin 12.4.  In April, normal thyroid testing.  Negative pregnancy test in August.  She is on no medications.  She does have a history of migraines.  REVIEW OF SYSTEMS:  All non-GI ROS negative unless otherwise stated in HPI.   Past Medical History:  Diagnosis Date   Asthma    Migraine     Past Surgical History:  Procedure Laterality Date   NO PAST SURGERIES      Social History Jasmine Daugherty  reports that she has never smoked. She has never used smokeless tobacco. She reports current alcohol use. She reports that she does not use drugs.  family history is not on file.  Allergies  Allergen Reactions   Tree Extract Anaphylaxis    Tree nuts       PHYSICAL EXAMINATION: Vital signs: BP 126/78   Pulse 68   Ht 5\' 7"  (1.702 m)   Wt 283 lb 3.2 oz (128.5 kg)   SpO2 97%   BMI  44.36 kg/m   Constitutional: generally well-appearing, no acute distress Psychiatric: alert and oriented x3, cooperative Eyes: extraocular movements intact, anicteric, conjunctiva pink Mouth: oral pharynx moist, no lesions Neck: supple no lymphadenopathy Cardiovascular: heart regular rate and rhythm, no murmur Lungs: clear to auscultation bilaterally Abdomen: soft, nontender, nondistended, no obvious ascites, no peritoneal signs, normal bowel sounds, no organomegaly Rectal: Omitted Extremities: no clubbing, cyanosis, or lower extremity edema bilaterally Skin: no lesions on visible extremities Neuro: No focal deficits.  Cranial nerves intact  ASSESSMENT:  1.  3-month history of recurrent problems with epigastric pain associated with nausea and rare vomiting.  Rule out biliary colic.  Rule out GERD.  Rule out peptic ulcer disease. 2.  1 month history of intermittent loose stools preceded by abdominal cramping and urgency. 3.  Obesity 4.  History of migraine headaches  PLAN:  1.  Schedule pantoprazole 40 mg daily.  This for reflux disease and possible peptic ulcer disease. 2.  Prescribe Bentyl 20 mg.  May take 1 every 4-6 hours as needed for lower abdominal cramping pain 3.  Laboratories today including CBC, comprehensive metabolic panel, lipase, C-reactive protein 4.  Schedule abdominal ultrasound.  Rule out gallstones 5.  Schedule upper endoscopy to evaluate abdominal pain, nausea/vomiting, and weight lossThe nature of the procedure, as well as the  risks, benefits, and alternatives were carefully and thoroughly reviewed with the patient. Ample time for discussion and questions allowed. The patient understood, was satisfied, and agreed to proceed. A total time of 60 minutes was spent.  See the patient, reviewing outside data, obtaining comprehensive history, performing medically appropriate physical examination, counseling and educating the patient regarding the above listed issues,  ordering multiple medications, ordering multiple laboratories, ordering advanced radiology study, ordering endoscopic procedure.  Finally, documenting clinical information in the patient's health record.

## 2022-09-13 NOTE — Patient Instructions (Signed)
_______________________________________________________  If you are age 25 or older, your body mass index should be between 23-30. Your Body mass index is 44.36 kg/m. If this is out of the aforementioned range listed, please consider follow up with your Primary Care Provider.  If you are age 80 or younger, your body mass index should be between 19-25. Your Body mass index is 44.36 kg/m. If this is out of the aformentioned range listed, please consider follow up with your Primary Care Provider.   ________________________________________________________  The [place] [facility] providers would like to encourage you to use [facility] to communicate with providers for non-urgent requests or questions.  Due to long hold times on the telephone, sending your provider a message by [facility] may be a faster and more efficient way to get a response.  Please allow 48 business hours for a response.  Please remember that this is for non-urgent requests.  _______________________________________________________  We have sent the following medications to your pharmacy for you to pick up at your convenience:  Pantoprazole, Bentyl  Your provider has requested that you go to the basement level for lab work before leaving today. Press "B" on the elevator. The lab is located at the first door on the left as you exit the elevator.  You will be contacted by [facility] Sched[facility] in the next 2 days to arrange an ultrasound.  The number on your caller ID will be [phone], please answer when they call.  If you have not heard from them in 2 days please call [phone] to schedule.

## 2022-09-15 ENCOUNTER — Ambulatory Visit (HOSPITAL_COMMUNITY): Payer: Self-pay | Attending: Internal Medicine

## 2022-09-15 ENCOUNTER — Encounter (HOSPITAL_COMMUNITY): Payer: Self-pay

## 2022-09-26 ENCOUNTER — Other Ambulatory Visit (HOSPITAL_COMMUNITY): Payer: Self-pay

## 2022-09-27 ENCOUNTER — Encounter: Payer: Self-pay | Admitting: Internal Medicine

## 2023-01-22 IMAGING — CT CT HEAD W/O CM
4 series · 17 of 47 positions shown, 19 images · non-contrast
Comparison: CT brain 01/23/2018

CLINICAL DATA: Left-sided headache

EXAM:
CT HEAD WITHOUT CONTRAST
TECHNIQUE: Contiguous axial images were obtained from the base of the skull
through the vertex without intravenous contrast.

[Series 2: head wo · axial · 0.43mm/px · z∈[-137,-17]mm · 7 of 32 slices shown, 9 images]
[im 4/32  brain]
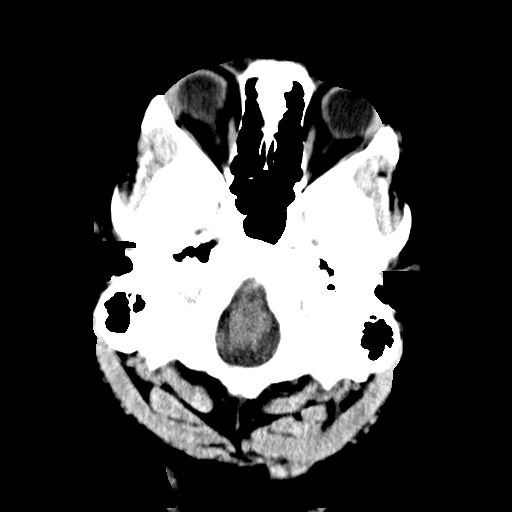
[im 4/32  bone]
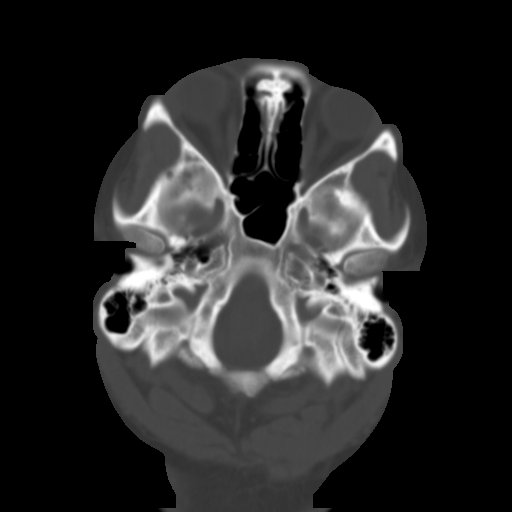
[im 8/32  brain]
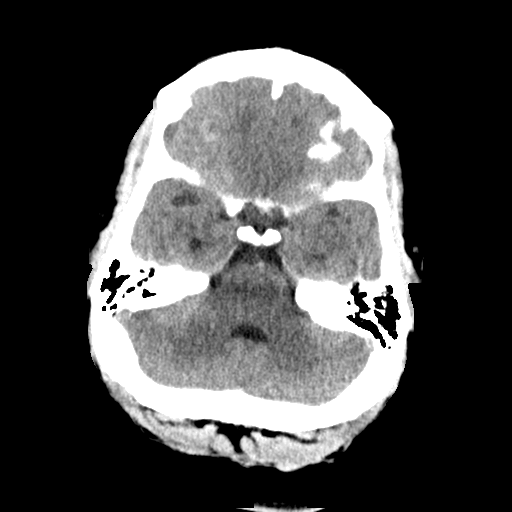
[im 12/32  brain]
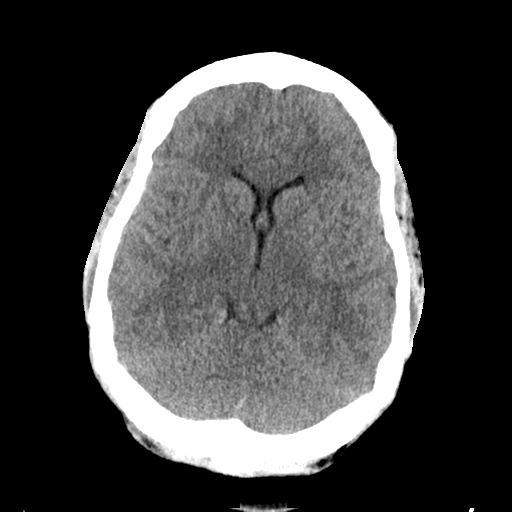
[im 16/32  brain]
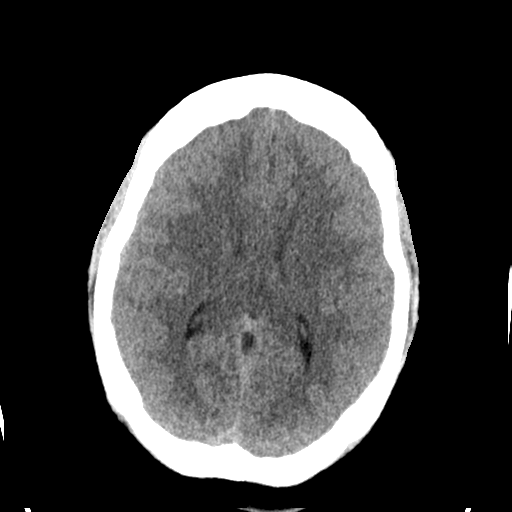
[im 20/32  brain]
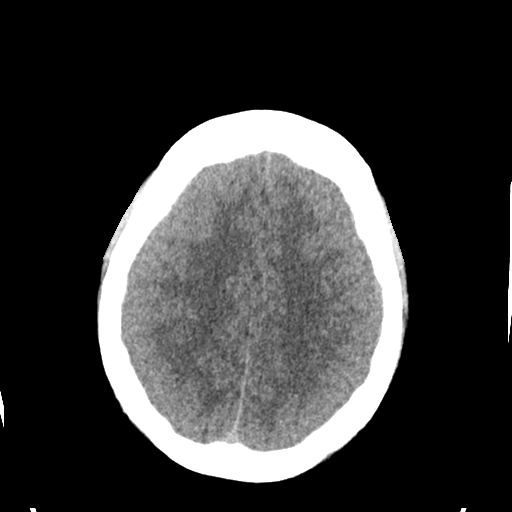
[im 20/32  bone]
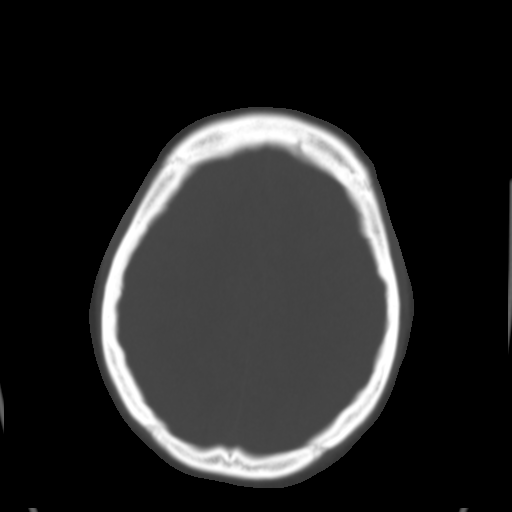
[im 24/32  brain]
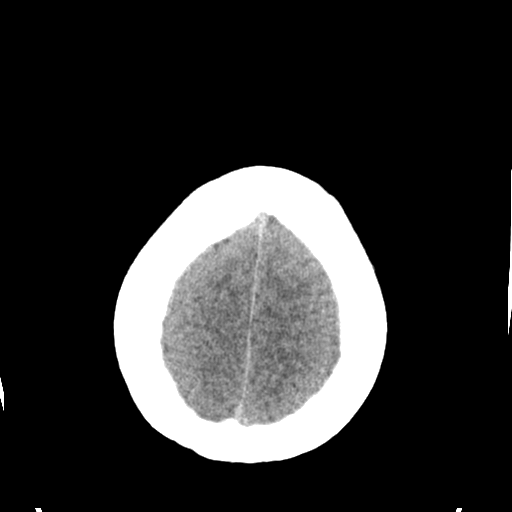
[im 28/32  brain]
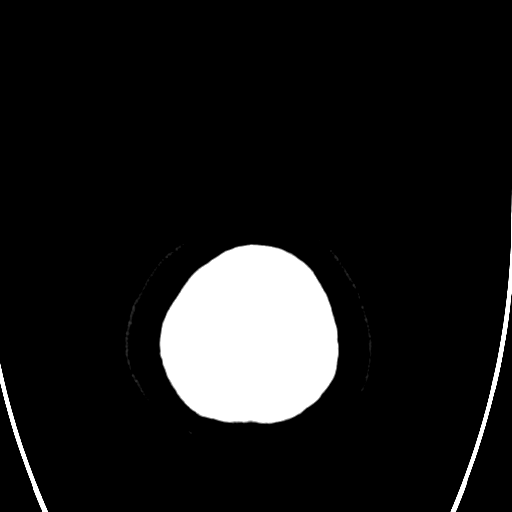

[Series 3: head bone · axial · 0.43mm/px · z∈[-138,-84]mm · 4 of 78 slices shown]
[im 8/78  bone]
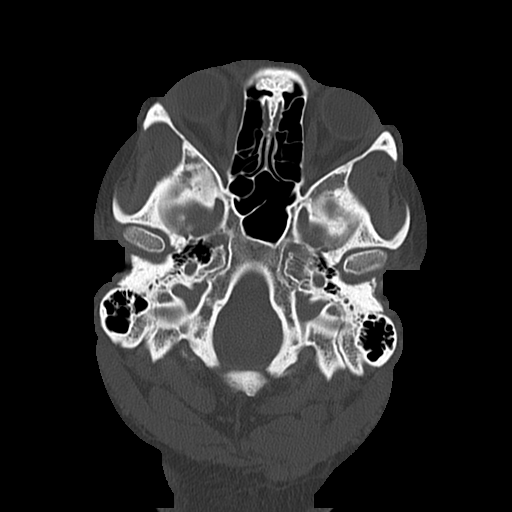
[im 16/78  bone]
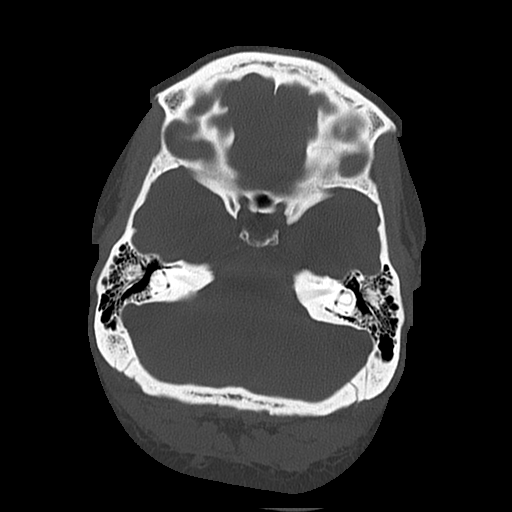
[im 24/78  bone]
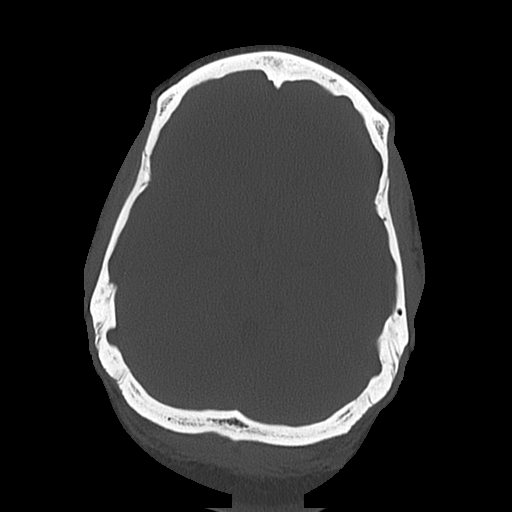
[im 35/78  bone]
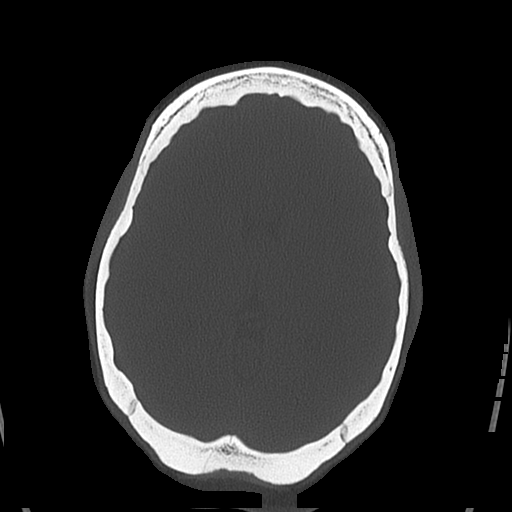

[Series 4: coronal soft · coronal · 0.30mm/px · 3 of 64 slices shown]
[im 22/64  brain]
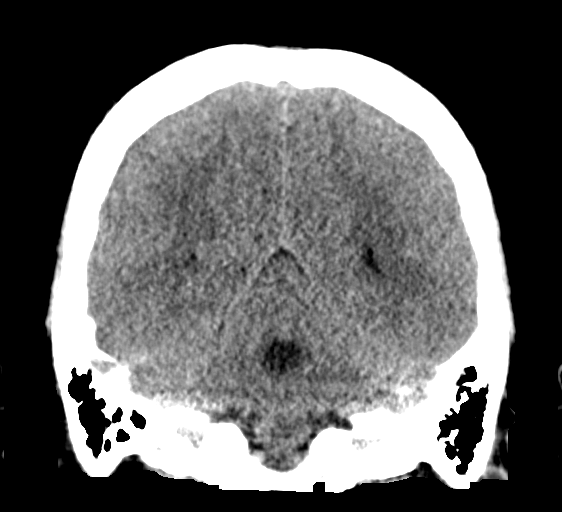
[im 29/64  brain]
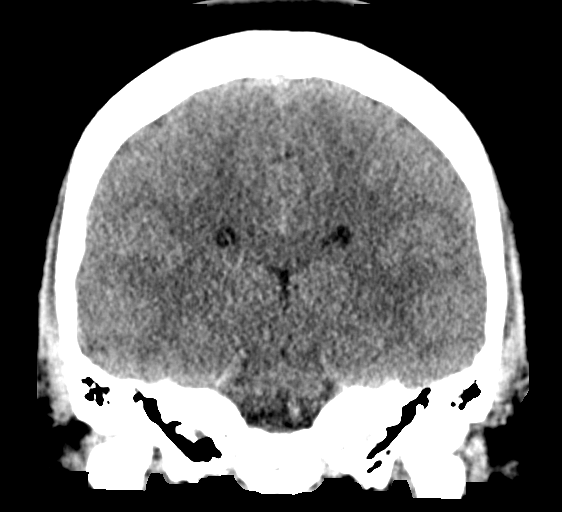
[im 36/64  brain]
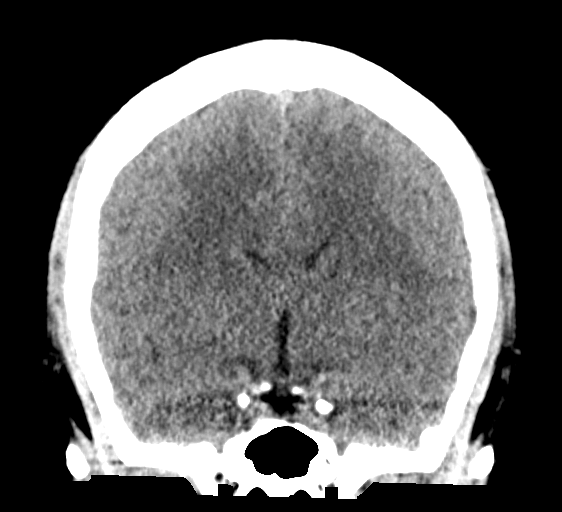

[Series 5: sagittal soft · sagittal · 0.30mm/px · 3 of 57 slices shown]
[im 19/57  brain]
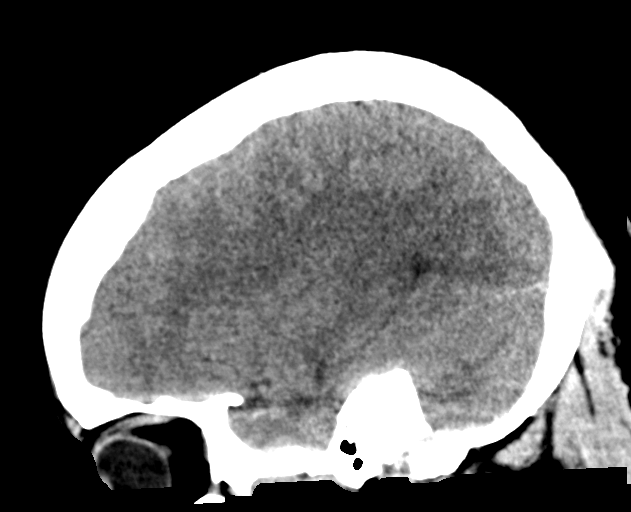
[im 29/57  brain]
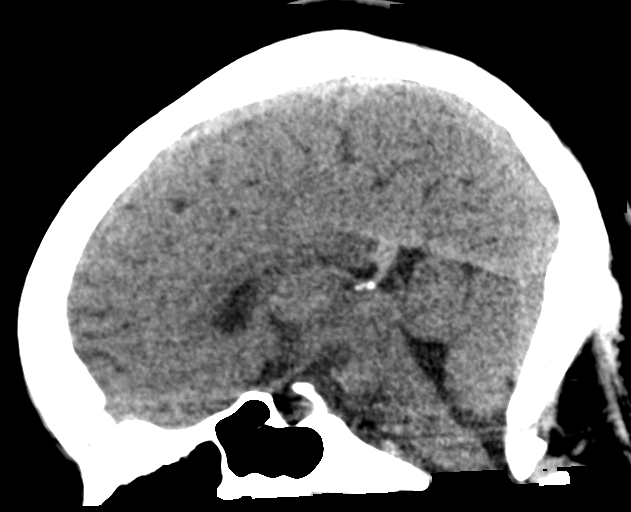
[im 38/57  brain]
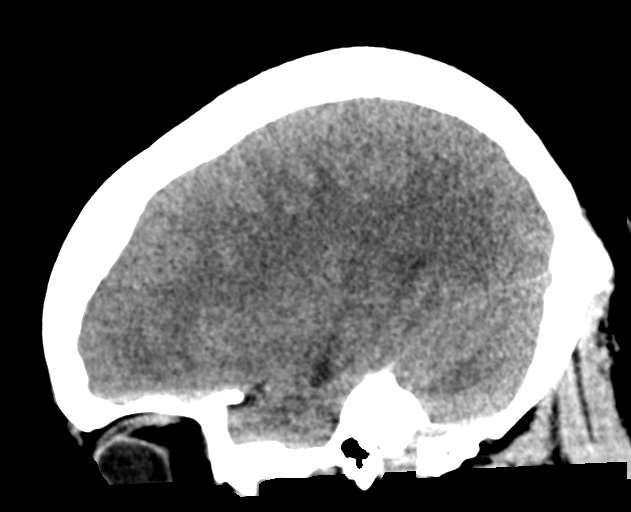

[17 of 47 positions shown; findings below may reference images not displayed]

FINDINGS: Brain: No evidence of acute infarction, hemorrhage, hydrocephalus,
extra-axial collection or mass lesion/mass effect.

Vascular: No hyperdense vessel or unexpected calcification.

Skull: Normal. Negative for fracture or focal lesion.

Sinuses/Orbits: No acute finding.

Other: None
IMPRESSION: Negative non contrasted CT appearance of brain

## 2023-03-30 DIAGNOSIS — F322 Major depressive disorder, single episode, severe without psychotic features: Secondary | ICD-10-CM | POA: Insufficient documentation

## 2023-03-30 DIAGNOSIS — E010 Iodine-deficiency related diffuse (endemic) goiter: Secondary | ICD-10-CM | POA: Insufficient documentation

## 2023-03-30 DIAGNOSIS — Z131 Encounter for screening for diabetes mellitus: Secondary | ICD-10-CM | POA: Insufficient documentation

## 2023-03-30 DIAGNOSIS — F419 Anxiety disorder, unspecified: Secondary | ICD-10-CM | POA: Insufficient documentation

## 2023-03-30 DIAGNOSIS — G5601 Carpal tunnel syndrome, right upper limb: Secondary | ICD-10-CM | POA: Insufficient documentation

## 2023-03-30 DIAGNOSIS — J4520 Mild intermittent asthma, uncomplicated: Secondary | ICD-10-CM | POA: Insufficient documentation

## 2023-04-13 ENCOUNTER — Ambulatory Visit (AMBULATORY_SURGERY_CENTER): Payer: No Typology Code available for payment source

## 2023-04-13 VITALS — Ht 67.0 in | Wt 230.0 lb

## 2023-04-13 DIAGNOSIS — R197 Diarrhea, unspecified: Secondary | ICD-10-CM

## 2023-04-13 DIAGNOSIS — R112 Nausea with vomiting, unspecified: Secondary | ICD-10-CM

## 2023-04-13 DIAGNOSIS — R109 Unspecified abdominal pain: Secondary | ICD-10-CM

## 2023-04-13 DIAGNOSIS — R1013 Epigastric pain: Secondary | ICD-10-CM

## 2023-04-13 NOTE — Progress Notes (Signed)
Pre visit completed via phone call; Patient verified name, DOB, and address; No egg or soy allergy known to patient;  No issues known to pt with past sedation with any surgeries or procedures; Patient denies ever being told they had issues or difficulty with intubation;  No FH of Malignant Hyperthermia; Pt is not on diet pills; Pt is not on home 02;  Pt is not on blood thinners;  Pt denies issues with constipation; No A fib or A flutter; Have any cardiac testing pending--NO Pt instructed to use Singlecare.com or GoodRx for a price reduction on prep;   Insurance verified during PV appt=[employer] Multiplan  Patient's chart reviewed by Cathlyn Parsons CNRA prior to previsit and patient appropriate for the [facility].  Previsit completed and red dot placed by patient's name on their procedure day (on provider's schedule).    Instructions sent to patient's MyChart per her request;

## 2023-04-27 ENCOUNTER — Encounter: Payer: No Typology Code available for payment source | Admitting: Internal Medicine

## 2023-04-27 ENCOUNTER — Telehealth: Payer: Self-pay | Admitting: *Deleted

## 2023-04-27 NOTE — Telephone Encounter (Signed)
Pt did not show up for EGD with Dr. Marina Goodell today.  She stated that she was told her insurance would not cover this procedure so she did not know whether she could have this done. She also ate bacon and waffles at 8:00 am.  Spoke with Dr. Marina Goodell.  Pt has not been seen in the office since December 2023 and will need to be seen by a provider to evaluate if procedure is still needed. Appointment made for 07/11/23 with Doug Sou, PA

## 2023-07-11 ENCOUNTER — Ambulatory Visit (INDEPENDENT_AMBULATORY_CARE_PROVIDER_SITE_OTHER): Payer: No Typology Code available for payment source | Admitting: Gastroenterology

## 2023-07-11 ENCOUNTER — Encounter: Payer: Self-pay | Admitting: Gastroenterology

## 2023-07-11 VITALS — BP 118/68 | HR 67 | Ht 67.0 in | Wt 264.0 lb

## 2023-07-11 DIAGNOSIS — R110 Nausea: Secondary | ICD-10-CM | POA: Diagnosis not present

## 2023-07-11 NOTE — Progress Notes (Signed)
07/11/2023 Jasmine Daugherty [id number] October 10, 1995   HISTORY OF PRESENT ILLNESS:  This is a 27 year old female who is a patient of Dr. Lamar Sprinkles.  She is the daughter of Shemicka Cohrs, a previous employee of our office.  She was seen by Dr. Marina Goodell about 10 months ago for complaints of nausea, but was also having some upper abdominal pain and intermittent loose stools at that time as well.  Was scheduled for ultrasound and EGD, but did not proceed with endoscopy as it is not covered by her insurance at the time and there was some confusion about the ultrasound so she never had that performed either.  Basic labs completely normal including a CRP.  Now really just primarily has complaints of intermittent nausea that occurs about every other day, only lasts a couple minutes at a time.  Does not vomit.  No significant pain, etc. at this point.  No improvement with symptoms previously on pantoprazole/PPI therapy.  Says that she is moving her bowels well now.  She has insurance with her employer that we will start next month.  TSH in 2023 was normal.  She does not think she is pregnant.  Was previously smoking marijuana, but has stopped that and has not done that at all in about 2 and half to 3 months.  Only uses Zyrtec as needed, but does take a daily multivitamin in the form of a gummy.  No other vitamins, supplements, etc.  She wants to know why she tends this nausea so often.  Has not been able to correlate it with anything she does or foods, etc.  Past Medical History:  Diagnosis Date   Asthma    Migraine    Seasonal allergies    Past Surgical History:  Procedure Laterality Date   NO PAST SURGERIES      reports that she has never smoked. She has never used smokeless tobacco. She reports current alcohol use of about 1.0 standard drink of alcohol per week. She reports current drug use. Frequency: 7.00 times per week. Drug: Marijuana. family history is not on file. Allergies  Allergen Reactions    Tree Extract Anaphylaxis    Tree nuts      Outpatient Encounter Medications as of 07/11/2023  Medication Sig   Cetirizine HCl (ZYRTEC PO) Take 1 tablet by mouth daily.   Multiple Vitamin (MULTIVITAMIN) capsule Take 1 capsule by mouth daily. GUMMIES   [DISCONTINUED] mometasone (NASONEX) 50 MCG/ACT nasal spray Use  2 sprays in each nostril once a day   No facility-administered encounter medications on file as of 07/11/2023.    REVIEW OF SYSTEMS  : All other systems reviewed and negative except where noted in the History of Present Illness.   PHYSICAL EXAM: BP 118/68   Pulse 67   Ht 5\' 7"  (1.702 m)   Wt 264 lb (119.7 kg)   BMI 41.35 kg/m  General: Well developed female in no acute distress Head: Normocephalic and atraumatic Eyes:  Sclerae anicteric, conjunctiva pink. Ears: Normal auditory acuity Lungs: Clear throughout to auscultation; no W/R/R. Heart: Regular rate and rhythm; no M/R/G. Musculoskeletal: Symmetrical with no gross deformities  Skin: No lesions on visible extremities Extremities: No edema  Neurological: Alert oriented x 4, grossly non-focal Psychological:  Alert and cooperative. Normal mood and affect  ASSESSMENT AND PLAN: *Nausea: Patient was seen by Dr. Marina Goodell about 10 months ago for complaints of nausea, but was also having some upper abdominal pain and intermittent loose stools at  that time as well.  Was scheduled for ultrasound and EGD, but did not proceed with endoscopy as it is not covered by her insurance at the time and there was some confusion about the ultrasound so she never had that performed either.  Basic labs completely normal.  Now really just primarily has complaints of intermittent nausea that occurs about every other day, only lasts a couple minutes at a time.  No significant pain, etc. at this point.  No improvement with symptoms previously on pantoprazole/PPI therapy.  Will proceed with ultrasound and endoscopy.  She has insurance with her employer  that we will start next month.  Rule out reflux related issues, ulcer, biliary disease, etc.  Advised that sometimes nausea alone can be hormonal and other non-GI causes.  TSH in 2023 was normal.  She does not think she is pregnant.  Was previously smoking marijuana, but has stopped that and has not done that at all in about 2 and half to 3 months.  CC:  No ref. provider found

## 2023-07-11 NOTE — Progress Notes (Signed)
Noted  

## 2023-07-11 NOTE — Patient Instructions (Signed)
You have been scheduled for an abdominal ultrasound at [facility] (1st floor of hospital) on 07/12/23 at 11:30am. Please arrive 30 minutes prior to your appointment for registration. Make certain not to have anything to eat or drink 6 hours prior to your appointment. Should you need to reschedule your appointment, please contact radiology at [phone]. This test typically takes about 30 minutes to perform.   You have been scheduled for an endoscopy. Please follow written instructions given to you at your visit today.  If you use inhalers (even only as needed), please bring them with you on the day of your procedure.  If you take any of the following medications, they will need to be adjusted prior to your procedure:   DO NOT TAKE 7 DAYS PRIOR TO TEST- Trulicity (dulaglutide) Ozempic, Wegovy (semaglutide) Mounjaro (tirzepatide) Bydureon Bcise (exanatide extended release)  DO NOT TAKE 1 DAY PRIOR TO YOUR TEST Rybelsus (semaglutide) Adlyxin (lixisenatide) Victoza (liraglutide) Byetta (exanatide) _____________________________________________________________________  _______________________________________________________  If your blood pressure at your visit was 140/90 or greater, please contact your primary care physician to follow up on this.  _______________________________________________________  If you are age 26 or older, your body mass index should be between 23-30. Your Body mass index is 41.35 kg/m. If this is out of the aforementioned range listed, please consider follow up with your Primary Care Provider.  If you are age 36 or younger, your body mass index should be between 19-25. Your Body mass index is 41.35 kg/m. If this is out of the aformentioned range listed, please consider follow up with your Primary Care Provider.   ________________________________________________________  The [place] [facility] providers would like to encourage you to use [facility] to  communicate with providers for non-urgent requests or questions.  Due to long hold times on the telephone, sending your provider a message by [facility] may be a faster and more efficient way to get a response.  Please allow 48 business hours for a response.  Please remember that this is for non-urgent requests.  _______________________________________________________

## 2023-07-12 ENCOUNTER — Ambulatory Visit (HOSPITAL_COMMUNITY)
Admission: RE | Admit: 2023-07-12 | Discharge: 2023-07-12 | Disposition: A | Discharge status: Home / Self Care | Payer: Medicaid Other | Source: Ambulatory Visit | Attending: Gastroenterology | Admitting: Gastroenterology

## 2023-07-12 DIAGNOSIS — R110 Nausea: Secondary | ICD-10-CM | POA: Diagnosis not present

## 2023-07-12 DIAGNOSIS — H5213 Myopia, bilateral: Secondary | ICD-10-CM | POA: Diagnosis not present

## 2023-08-20 ENCOUNTER — Telehealth: Payer: Self-pay | Admitting: Internal Medicine

## 2023-08-20 ENCOUNTER — Encounter: Payer: No Typology Code available for payment source | Admitting: Internal Medicine

## 2023-08-20 NOTE — Telephone Encounter (Signed)
PT confirmed she will not be here for her EGD at 10am today.

## 2023-08-20 NOTE — Telephone Encounter (Signed)
Good Morning Dr. Marina Goodell,  This patient answered NO on the automated machine on Friday.  She also confirmed this morning that she was NOT coming for her procedure.

## 2024-01-02 ENCOUNTER — Encounter: Payer: Self-pay | Admitting: Student in an Organized Health Care Education/Training Program

## 2024-01-02 ENCOUNTER — Ambulatory Visit: Admitting: Student in an Organized Health Care Education/Training Program

## 2024-01-02 ENCOUNTER — Telehealth: Payer: Self-pay

## 2024-01-02 ENCOUNTER — Other Ambulatory Visit (HOSPITAL_COMMUNITY): Payer: Self-pay

## 2024-01-02 VITALS — BP 118/78 | HR 73 | Temp 97.9°F | Ht 68.0 in | Wt 296.0 lb

## 2024-01-02 DIAGNOSIS — Z6841 Body Mass Index (BMI) 40.0 and over, adult: Secondary | ICD-10-CM

## 2024-01-02 DIAGNOSIS — R5383 Other fatigue: Secondary | ICD-10-CM | POA: Diagnosis not present

## 2024-01-02 DIAGNOSIS — R0683 Snoring: Secondary | ICD-10-CM | POA: Diagnosis not present

## 2024-01-02 DIAGNOSIS — R87619 Unspecified abnormal cytological findings in specimens from cervix uteri: Secondary | ICD-10-CM

## 2024-01-02 LAB — COMPREHENSIVE METABOLIC PANEL WITH GFR
ALT: 13 U/L (ref 0–35)
AST: 20 U/L (ref 0–37)
Albumin: 4.1 g/dL (ref 3.5–5.2)
Alkaline Phosphatase: 62 U/L (ref 39–117)
BUN: 12 mg/dL (ref 6–23)
CO2: 26 meq/L (ref 19–32)
Calcium: 9.3 mg/dL (ref 8.4–10.5)
Chloride: 104 meq/L (ref 96–112)
Creatinine, Ser: 0.87 mg/dL (ref 0.40–1.20)
GFR: 92.02 mL/min (ref >60.00)
Glucose, Bld: 147 mg/dL — ABNORMAL HIGH (ref 70–99)
Potassium: 4.7 meq/L (ref 3.5–5.1)
Sodium: 137 meq/L (ref 135–145)
Total Bilirubin: 0.6 mg/dL (ref 0.2–1.2)
Total Protein: 7.5 g/dL (ref 6.0–8.3)

## 2024-01-02 LAB — CBC WITH DIFFERENTIAL/PLATELET
Basophils Absolute: 0 10*3/uL (ref 0.0–0.1)
Basophils Relative: 0.7 % (ref 0.0–3.0)
Eosinophils Absolute: 0.2 10*3/uL (ref 0.0–0.7)
Eosinophils Relative: 3.5 % (ref 0.0–5.0)
HCT: 39.2 % (ref 36.0–46.0)
Hemoglobin: 13.2 g/dL (ref 12.0–15.0)
Lymphocytes Relative: 30.6 % (ref 12.0–46.0)
Lymphs Abs: 2.1 10*3/uL (ref 0.7–4.0)
MCHC: 33.5 g/dL (ref 30.0–36.0)
MCV: 93.9 fl (ref 78.0–100.0)
Monocytes Absolute: 0.4 10*3/uL (ref 0.1–1.0)
Monocytes Relative: 5.9 % (ref 3.0–12.0)
Neutro Abs: 4 10*3/uL (ref 1.4–7.7)
Neutrophils Relative %: 59.3 % (ref 43.0–77.0)
Platelets: 337 10*3/uL (ref 150.0–400.0)
RBC: 4.18 Mil/uL (ref 3.87–5.11)
RDW: 13.1 % (ref 11.5–15.5)
WBC: 6.8 10*3/uL (ref 4.0–10.5)

## 2024-01-02 LAB — LIPID PANEL
Cholesterol: 143 mg/dL (ref 0–200)
HDL: 56.2 mg/dL (ref >39.00)
LDL Cholesterol: 76 mg/dL (ref 0–99)
NonHDL: 87.12
Total CHOL/HDL Ratio: 3
Triglycerides: 57 mg/dL (ref 0.0–149.0)
VLDL: 11.4 mg/dL (ref 0.0–40.0)

## 2024-01-02 LAB — TSH: TSH: 2.24 u[IU]/mL (ref 0.35–5.50)

## 2024-01-02 LAB — HEMOGLOBIN A1C: Hgb A1c MFr Bld: 5.5 % (ref 4.6–6.5)

## 2024-01-02 MED ORDER — TIRZEPATIDE-WEIGHT MANAGEMENT 2.5 MG/0.5ML ~~LOC~~ SOLN: 2.5000 mg | SUBCUTANEOUS | 0 refills | Status: DC

## 2024-01-02 NOTE — Assessment & Plan Note (Signed)
 Chronic issue of snoring but now with worsening daytime somnolence and fatigue.  Seems to have overall poor sleep quality.  STOP-BANG score is 5, which is high risk for sleep apnea.  Her mother has sleep apnea and uses CPAP machine.  Patient would be willing to use a CPAP machine if we confirm the diagnosis.  Will order a home sleep study.

## 2024-01-02 NOTE — Assessment & Plan Note (Signed)
 Acute issue over the last 6 weeks.  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  She is at risk for other conditions contributing to the fatigue like undiagnosed sleep apnea which I am going to evaluate with a home sleep study.  Exam is reassuring.  Will check CBC to rule out anemia, and iron levels to rule out iron deficiency.  Will check TSH to rule out hypothyroidism.  We talked about good sleep hygiene, and the difficult nature of night shift work especially over the first year.

## 2024-01-02 NOTE — Assessment & Plan Note (Signed)
 History of HPV positive and ASCUS in 2020.  I do not have records of follow-up.  Will plan to do cytology and HPV testing in 6 weeks at her next visit.

## 2024-01-02 NOTE — Assessment & Plan Note (Signed)
 Chronic issue of a young woman with severe obesity.  Weight today is 296 pounds with a BMI of 45.  Will check A1c, lipids, and CMP to evaluate for any complications of the obesity.  I think she is at risk for future complications.  We talked about nutrition and exercise programs.  We talked about calorie counting, decided to use a structured nutrition app to help her achieve a calorie deficit.  We also talked about a structure exercise program.  She is starting a physical therapy program through her work soon.  We talked about medication assisted treatment of this severe obesity.  I recommended GLP-1 agonist as the most effective therapy.  She is not currently on treatment, has not tried this before.  Will prescribe Zepbound.  Patient will continue structured lifestyle modifications including a nutrition diet plan as well as exercise program.  She will follow-up with me every 4-8 weeks.  She will not use this medication with any other GLP-1 agonist.  She has no contraindications to the GLP-1 agonist medication classes.

## 2024-01-02 NOTE — Progress Notes (Signed)
 New Patient Office Visit  Subjective    Patient ID: Jasmine Daugherty, female    DOB: 18-Oct-1995  Age: 28 y.o. MRN: [id number]  CC:   Chief Complaint  Patient presents with   Establish Care    Patient states she has felt very establish care. Non-Fasting.     HPI  Jasmine Daugherty presents to establish care  28 year old person living with obesity here for management of that condition.  She has an acute concern today of increasing fatigue over the last 6 weeks.  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  She works from [employer] 4 nights a week.  She struggles with access tiredness both in the day and while she is at work.  She reports difficulty staying asleep during the day when she returns home.  Reports a history of snoring, family has told her she has apneic events, no prior evaluations for sleep apnea, her mother and grandmother both have sleep apnea.  Denies any heavy menstrual bleeding.  Has some dyspnea with exertion walking up stairs.  No recent illness or, no fevers or chills, no chest pain or pressure.  No joint pains.  She lives by herself.  Previously married, now single.  No children.  She has no concerns about tobacco or alcohol use.  She is interested in starting a new exercise program.  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  She reports not being sexually active currently.  Not currently on birth control, but she is going to consider an IUD in the future.   Outpatient Encounter Medications as of 01/02/2024  Medication Sig   Cetirizine HCl (ZYRTEC PO) Take 1 tablet by mouth daily.   Multiple Vitamin (MULTIVITAMIN) capsule Take 1 capsule by mouth daily. GUMMIES   tirzepatide (ZEPBOUND) 2.5 MG/0.5ML injection vial Inject 2.5 mg into the skin once a week.   No  facility-administered encounter medications on file as of 01/02/2024.    Past Medical History:  Diagnosis Date   Asthma    Migraine    Migraine 11/20/2017   Seasonal allergies     Past Surgical History:  Procedure Laterality Date   NO PAST SURGERIES      Family History  Problem Relation Age of Onset   High blood pressure Mother    Lung cancer Maternal Grandmother    Lung cancer Paternal Grandmother    Migraines Neg Hx    Colon cancer Neg Hx    Stomach cancer Neg Hx    Esophageal cancer Neg Hx    Colon polyps Neg Hx    Rectal cancer Neg Hx     Social History   Socioeconomic History   Marital status: Single    Spouse name: Not on file   Number of children: 0   Years of education: Not on file   Highest education level: Not on file  Occupational History   Not on file  Tobacco Use   Smoking status: Never   Smokeless tobacco: Never  Vaping Use   Vaping status: Never Used  Substance and Sexual Activity   Alcohol use: Yes    Alcohol/week: 1.0 standard drink of alcohol    Types: 1 Standard drinks or equivalent per week    Comment: special occasions   Drug use: Yes    Frequency: 7.0 times  per week    Types: Marijuana   Sexual activity: Yes    Birth control/protection: None  Other Topics Concern   Not on file  Social History Narrative   Lives with her mom    Left handed   Caffeine: coffee daily, a lot. Occasional soda. Drinks plenty of water.   Social Drivers of Corporate investment banker Strain: Not on file  Food Insecurity: Not on file  Transportation Needs: Not on file  Physical Activity: Not on file  Stress: Not on file  Social Connections: Not on file  Intimate Partner Violence: Not on file        Objective    BP 118/78   Pulse 73   Temp 97.9 F (36.6 C) (Temporal)   Ht 5\' 8"  (1.727 m)   Wt 296 lb (134.3 kg)   SpO2 99%   BMI 45.01 kg/m   Physical Exam  General: Well-appearing woman, no distress Ears: Bilateral normal tympanic  membranes Neck: Enlarged neck circumference greater than 40 cm, moderately enlarged thyroid gland at the base, no discrete nodules or adenopathy Heart: Regular, no murmur Lungs: Unlabored, clear throughout Abd: Obese, soft, no organomegaly Ext: Warm, well-perfused, no edema, normal joints Neuro: Alert, conversational, full strength upper and lower extremities Psych: Appropriate mood and affect, not depressed or anxious.      Assessment & Plan:   Problem List Items Addressed This Visit       High   Severe obesity (BMI >= 40) (HCC) (Chronic)   Chronic issue of a young woman with severe obesity.  Weight today is 296 pounds with a BMI of 45.  Will check A1c, lipids, and CMP to evaluate for any complications of the obesity.  I think she is at risk for future complications.  We talked about nutrition and exercise programs.  We talked about calorie counting, decided to use a structured nutrition app to help her achieve a calorie deficit.  We also talked about a structure exercise program.  She is starting a physical therapy program through her work soon.  We talked about medication assisted treatment of this severe obesity.  I recommended GLP-1 agonist as the most effective therapy.  She is not currently on treatment, has not tried this before.  Will prescribe Zepbound.  Patient will continue structured lifestyle modifications including a nutrition diet plan as well as exercise program.  She will follow-up with me every 4-8 weeks.  She will not use this medication with any other GLP-1 agonist.  She has no contraindications to the GLP-1 agonist medication classes.      Relevant Medications   tirzepatide (ZEPBOUND) 2.5 MG/0.5ML injection vial   Other Relevant Orders   Comprehensive metabolic panel with GFR   Hemoglobin A1c   Lipid panel   TSH     Medium    Snoring (Chronic)   Chronic issue of snoring but now with worsening daytime somnolence and fatigue.  Seems to have overall poor sleep  quality.  STOP-BANG score is 5, which is high risk for sleep apnea.  Her mother has sleep apnea and uses CPAP machine.  Patient would be willing to use a CPAP machine if we confirm the diagnosis.  Will order a home sleep study.      Relevant Orders   Home sleep test     Low   Abnormal cervical Papanicolaou smear (Chronic)   History of HPV positive and ASCUS in 2020.  I do not have records of follow-up.  Will  plan to do cytology and HPV testing in 6 weeks at her next visit.        Unprioritized   Fatigue - Primary   Acute issue over the last 6 weeks.  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  She is at risk for other conditions contributing to the fatigue like undiagnosed sleep apnea which I am going to evaluate with a home sleep study.  Exam is reassuring.  Will check CBC to rule out anemia, and iron levels to rule out iron deficiency.  Will check TSH to rule out hypothyroidism.  We talked about good sleep hygiene, and the difficult nature of night shift work especially over the first year.      Relevant Orders   Comprehensive metabolic panel with GFR   CBC with Differential/Platelet   TSH   Iron, TIBC and Ferritin Panel    Return in about 6 weeks (around 02/13/2024).   Tyson Alias, MD

## 2024-01-02 NOTE — Telephone Encounter (Signed)
 Pharmacy Patient Advocate Encounter   Received notification from CoverMyMeds that prior authorization for  Zepbound 2.5MG /0.5ML pen-injectors is required/requested.   Insurance verification completed.   The patient is insured through [facility] .   Per test claim: PA required; PA submitted to above mentioned insurance via CoverMyMeds Key/confirmation #/EOC [id number] Status is pending

## 2024-01-03 ENCOUNTER — Telehealth: Payer: Self-pay

## 2024-01-03 ENCOUNTER — Encounter: Payer: Self-pay | Admitting: Student in an Organized Health Care Education/Training Program

## 2024-01-03 LAB — IRON,TIBC AND FERRITIN PANEL
%SAT: 17 % (ref 16–45)
Ferritin: 50 ng/mL (ref 16–154)
Iron: 53 ug/dL (ref 40–190)
TIBC: 317 ug/dL (ref 250–450)

## 2024-01-03 NOTE — Telephone Encounter (Signed)
 Can we follow up on this sleep study referral please.

## 2024-01-03 NOTE — Addendum Note (Signed)
 Addended by: Erlinda Hong T on: 01/03/2024 08:14 AM   Modules accepted: Orders

## 2024-01-03 NOTE — Telephone Encounter (Signed)
-----   Message from [employer] sent at 01/03/2024  8:14 AM EDT ----- Hello friends,  I have ordered a home sleep study for this person to rule out OSA. I sent it to the [facility]. Can we follow up to ensure the order made it to the right person? Thank you.

## 2024-01-04 NOTE — Telephone Encounter (Signed)
Yes please, and thank you

## 2024-01-04 NOTE — Telephone Encounter (Signed)
 Okay for [place] to provide a work note?

## 2024-01-09 ENCOUNTER — Encounter: Payer: Self-pay | Admitting: Student in an Organized Health Care Education/Training Program

## 2024-01-10 NOTE — Telephone Encounter (Signed)
 Can we check if this needs a PA or is just denied at this time?

## 2024-01-14 ENCOUNTER — Telehealth: Payer: Self-pay

## 2024-01-14 ENCOUNTER — Other Ambulatory Visit (HOSPITAL_COMMUNITY): Payer: Self-pay

## 2024-01-14 MED ORDER — WEGOVY 0.25 MG/0.5ML ~~LOC~~ SOAJ: 0.2500 mg | SUBCUTANEOUS | 1 refills | Status: AC

## 2024-01-14 NOTE — Telephone Encounter (Signed)
 Pharmacy Patient Advocate Encounter   Received notification from Onbase that prior authorization for Zepbound 2.5MG /0.5ML pen-injectors is required/requested.   Insurance verification completed.   The patient is insured through [facility] .   Per test claim: PA required; PA submitted to above mentioned insurance via CoverMyMeds Key/confirmation #/EOC : [id number] Status is pending

## 2024-01-14 NOTE — Telephone Encounter (Signed)
 Would you like to send in one of these alternatives to trial?

## 2024-01-14 NOTE — Telephone Encounter (Signed)
 Pharmacy Patient Advocate Encounter  Received notification from [facility] that Prior Authorization for Zepbound 2.5MG /0.5ML pen-injectors  has been DENIED.  Full denial letter will be uploaded to the media tab. See denial reason below.   PA #/Case ID/Reference #: [id number]

## 2024-01-14 NOTE — Telephone Encounter (Signed)
 Pharmacy Patient Advocate Encounter   Received notification from [employer]s Messages that prior authorization for Wegovy 0.25MG /0.5ML auto-injectors is required/requested.   Insurance verification completed.   The patient is insured through [facility] .   Per test claim: PA required; PA submitted to above mentioned insurance via CoverMyMeds Key/confirmation #/EOC [id number] Status is pending

## 2024-01-14 NOTE — Telephone Encounter (Signed)
 PA request for Frederik Jansky has been Submitted. New Encounter has been or will be created for follow up. For additional info see Pharmacy Prior Auth telephone encounter from 01/14/24.

## 2024-01-14 NOTE — Telephone Encounter (Signed)
 Yes, Jasmine Daugherty is reasonable to try. I have sent rx to her pharmacy.

## 2024-01-14 NOTE — Telephone Encounter (Signed)
 Called patient to update her on the zepbound denial and that insurance wants her to try wegovy first. Patient did have questions on what the difference was for both the wegovy and the zepbound?   Patient verbalized understanding and wanted to know if we could give her a call and let her know if the wegovy gets approved or not?

## 2024-01-15 ENCOUNTER — Other Ambulatory Visit (HOSPITAL_COMMUNITY): Payer: Self-pay

## 2024-01-15 NOTE — Telephone Encounter (Signed)
 Pharmacy Patient Advocate Encounter  Received notification from [facility] that Prior Authorization for [facility] 0.25MG /0.5ML auto-injectors has been APPROVED from 01/15/24 to 07/16/24. Ran test claim, Copay is $4. This test claim was processed through [facility]- copay amounts may vary at other pharmacies due to pharmacy/plan contracts, or as the patient moves through the different stages of their insurance plan.   PA #/Case ID/Reference #: [id number]

## 2024-01-15 NOTE — Telephone Encounter (Signed)
 I spoke with the patient by phone and answered all her questions about [facility].

## 2024-01-16 ENCOUNTER — Other Ambulatory Visit (HOSPITAL_COMMUNITY): Payer: Self-pay

## 2024-03-10 ENCOUNTER — Other Ambulatory Visit: Payer: Self-pay | Admitting: Student in an Organized Health Care Education/Training Program

## 2024-03-10 NOTE — Telephone Encounter (Signed)
 NOT [facility] PATIENT OR PROVIDER

## 2024-04-17 ENCOUNTER — Ambulatory Visit: Admitting: Student in an Organized Health Care Education/Training Program

## 2024-04-29 ENCOUNTER — Ambulatory Visit: Admitting: Student in an Organized Health Care Education/Training Program

## 2024-04-29 VITALS — BP 114/75 | HR 70 | Wt 301.0 lb

## 2024-04-29 DIAGNOSIS — N6001 Solitary cyst of right breast: Secondary | ICD-10-CM | POA: Insufficient documentation

## 2024-04-29 NOTE — Patient Instructions (Signed)
  VISIT SUMMARY: Today, you were seen for right breast pain and a bruise that you have been experiencing for over a year. You also noticed a lump in the same area three weeks ago. Given your family history of breast cysts, we are taking steps to evaluate your symptoms further.  YOUR PLAN: -RIGHT BREAST CYSTS: A breast cyst is a fluid-filled sac within the breast, which can cause pain and tenderness. Given your symptoms and family history, we will perform a breast ultrasound to evaluate the cysts. Once we have the results, we will discuss the appropriate management plan.  INSTRUCTIONS: Please schedule a breast ultrasound as soon as possible. We will discuss the results and next steps after the ultrasound.

## 2024-04-29 NOTE — Progress Notes (Signed)
   Acute Office Visit  Subjective:     Patient ID: Jasmine Daugherty, female    DOB: 20-May-1996, 28 y.o.   MRN: [id number]  Chief Complaint  Patient presents with   Breast Problem    Patient states she has been experencing over the last year and never thought anything of it. Painful under right breast and 2 weeks ago started feeling a bump. Aunt and grandmother use to get cyst under breast.     HPI  Discussed the use of AI scribe software for clinical note transcription with the patient, who gave verbal consent to proceed.  History of Present Illness Jasmine Daugherty is a 28 year old female who presents with right breast pain and a bruise.  She has been experiencing intermittent pain in her right breast, specifically underneath, for over a year. The pain is significant enough to prevent her from sleeping on her chest. Approximately three weeks ago, she noticed a bruise in the same area after showering, which was accompanied by increased pain and a palpable lump. The pain has been severe enough to affect her movement, especially on days when it is more pronounced.  Her family history is significant for breast cysts, as her grandmother and aunt have had cysts in their breasts. Her mother suggested that she see a doctor due to this family history. She notes that her family has a history of larger breasts, which she thought might be contributing to her symptoms.  No fever, shivers, chills, or nipple discharge. The pain is tender and worsens with pressure. No history of abscesses or boils under the arms. She notes that her breasts become hard and tender around her period, but she distinguishes this from her current issue, describing it as a different type of discomfort.      Objective:    BP 114/75   Pulse 70   Wt (!) 301 lb (136.5 kg)   SpO2 100%   BMI 45.77 kg/m    Physical Exam  Gen: Well-appearing Breasts: Right breast is nontender, beneath the breast there is a subtle discoloration  of the skin and mild induration, looks like a healed cyst underneath the breast.  No nipple retraction, no other changes in the right or left breast.  No axillary lymphadenopathy on either side.  No underlying breast mass.      Assessment & Plan:   Problem List Items Addressed This Visit       Unprioritized   Breast cyst, right - Primary   She experiences intermittent pain and tenderness with a palpable cyst, low risk of malignancy based on exam today and her young age.  No family history of early breast cancer. No clear cyclical nature right now.  Because she is less than 34 years old, I ordered a breast ultrasound to evaluate the cysts rather than a mammogram.  Would like to know if it is simple versus complex. Will discuss results and management plan after the ultrasound.  No signs of cellulitis or underlying infection or abscess.  I do not think there is a need for antibiotics right now.      Relevant Orders   US  LIMITED ULTRASOUND INCLUDING AXILLA RIGHT BREAST    Return if symptoms worsen or fail to improve.  Cleatus Debby Specking, MD

## 2024-04-29 NOTE — Assessment & Plan Note (Addendum)
 She experiences intermittent pain and tenderness with a palpable cyst, low risk of malignancy based on exam today and her young age.  No family history of early breast cancer. No clear cyclical nature right now.  Because she is less than 27 years old, I ordered a breast ultrasound to evaluate the cysts rather than a mammogram.  Would like to know if it is simple versus complex. Will discuss results and management plan after the ultrasound.  No signs of cellulitis or underlying infection or abscess.  I do not think there is a need for antibiotics right now.

## 2024-04-30 ENCOUNTER — Inpatient Hospital Stay
Admission: RE | Admit: 2024-04-30 | Discharge: 2024-04-30 | Discharge status: Home / Self Care | Source: Ambulatory Visit | Attending: Student in an Organized Health Care Education/Training Program

## 2024-04-30 DIAGNOSIS — N6001 Solitary cyst of right breast: Secondary | ICD-10-CM

## 2024-05-29 ENCOUNTER — Other Ambulatory Visit: Payer: Self-pay | Admitting: Medical Genetics

## 2024-06-03 ENCOUNTER — Ambulatory Visit: Admitting: Student in an Organized Health Care Education/Training Program

## 2024-06-06 ENCOUNTER — Ambulatory Visit: Admitting: Student in an Organized Health Care Education/Training Program

## 2024-06-10 ENCOUNTER — Other Ambulatory Visit

## 2024-07-17 ENCOUNTER — Other Ambulatory Visit (HOSPITAL_COMMUNITY): Payer: Self-pay

## 2024-07-18 ENCOUNTER — Other Ambulatory Visit: Payer: Self-pay | Admitting: Medical Genetics

## 2024-07-18 DIAGNOSIS — Z006 Encounter for examination for normal comparison and control in clinical research program: Secondary | ICD-10-CM

## 2024-07-22 ENCOUNTER — Other Ambulatory Visit (HOSPITAL_COMMUNITY): Payer: Self-pay

## 2024-07-28 ENCOUNTER — Other Ambulatory Visit (HOSPITAL_COMMUNITY): Payer: Self-pay
